# Patient Record
Sex: Female | Born: 1975 | Race: White | Hispanic: No | Smoking: Former smoker
Health system: Southern US, Community
[De-identification: ages and names within clinical notes are randomized; demographics above are authoritative.]

## PROBLEM LIST (undated history)

## (undated) DIAGNOSIS — F32A Depression, unspecified: Secondary | ICD-10-CM

## (undated) DIAGNOSIS — M51369 Other intervertebral disc degeneration, lumbar region without mention of lumbar back pain or lower extremity pain: Secondary | ICD-10-CM

## (undated) DIAGNOSIS — M5136 Other intervertebral disc degeneration, lumbar region: Secondary | ICD-10-CM

## (undated) DIAGNOSIS — I493 Ventricular premature depolarization: Secondary | ICD-10-CM

## (undated) DIAGNOSIS — F329 Major depressive disorder, single episode, unspecified: Secondary | ICD-10-CM

## (undated) DIAGNOSIS — D649 Anemia, unspecified: Secondary | ICD-10-CM

## (undated) DIAGNOSIS — F419 Anxiety disorder, unspecified: Secondary | ICD-10-CM

## (undated) DIAGNOSIS — I491 Atrial premature depolarization: Secondary | ICD-10-CM

## (undated) HISTORY — DX: Ventricular premature depolarization: I49.3

## (undated) HISTORY — DX: Depression, unspecified: F32.A

## (undated) HISTORY — DX: Anxiety disorder, unspecified: F41.9

## (undated) HISTORY — DX: Other intervertebral disc degeneration, lumbar region: M51.36

## (undated) HISTORY — DX: Other intervertebral disc degeneration, lumbar region without mention of lumbar back pain or lower extremity pain: M51.369

## (undated) HISTORY — DX: Atrial premature depolarization: I49.1

## (undated) HISTORY — DX: Major depressive disorder, single episode, unspecified: F32.9

---

## 2001-03-27 ENCOUNTER — Other Ambulatory Visit: Admission: RE | Admit: 2001-03-27 | Discharge: 2001-03-27 | Payer: Self-pay | Admitting: Internal Medicine

## 2001-05-01 ENCOUNTER — Ambulatory Visit (HOSPITAL_COMMUNITY): Admission: RE | Admit: 2001-05-01 | Discharge: 2001-05-01 | Payer: Self-pay | Admitting: Obstetrics and Gynecology

## 2001-05-01 ENCOUNTER — Encounter: Payer: Self-pay | Admitting: Obstetrics and Gynecology

## 2001-09-25 ENCOUNTER — Inpatient Hospital Stay (HOSPITAL_COMMUNITY): Admission: AD | Admit: 2001-09-25 | Discharge: 2001-09-28 | Payer: Self-pay | Admitting: Obstetrics and Gynecology

## 2004-02-02 ENCOUNTER — Other Ambulatory Visit: Admission: RE | Admit: 2004-02-02 | Discharge: 2004-02-02 | Payer: Self-pay | Admitting: Obstetrics and Gynecology

## 2004-04-14 ENCOUNTER — Inpatient Hospital Stay (HOSPITAL_COMMUNITY): Admission: AD | Admit: 2004-04-14 | Discharge: 2004-04-14 | Payer: Self-pay | Admitting: Obstetrics and Gynecology

## 2004-10-16 ENCOUNTER — Ambulatory Visit (HOSPITAL_COMMUNITY): Admission: RE | Admit: 2004-10-16 | Discharge: 2004-10-16 | Payer: Self-pay | Admitting: Neurology

## 2005-12-19 IMAGING — US US OB COMP LESS 14 WK
1 series · 14 of 28 positions shown · non-contrast
Comparison: none

CLINICAL DATA: Positive pregnancy test.  7 week 4 day gestational age by LMP. 
Bleeding.  Pelvic pain.

[Series 1: us ob comp less 14 wk · 0.33mm/px · 14 of 32 slices shown]
[im 2/32]
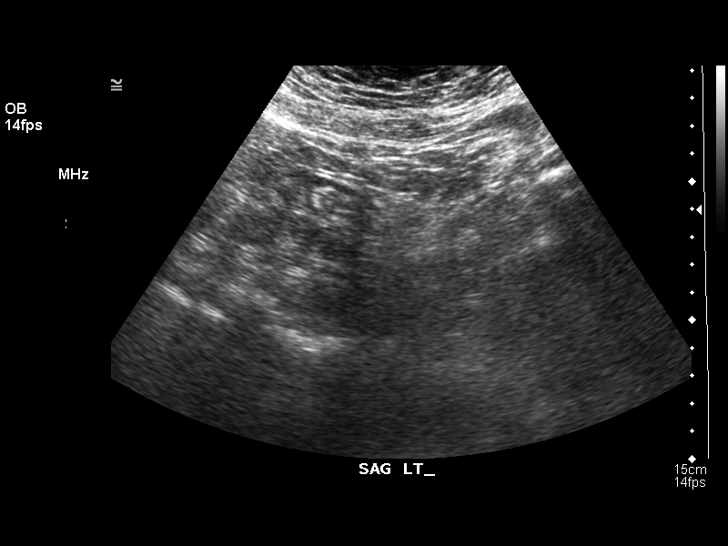
[im 4/32]
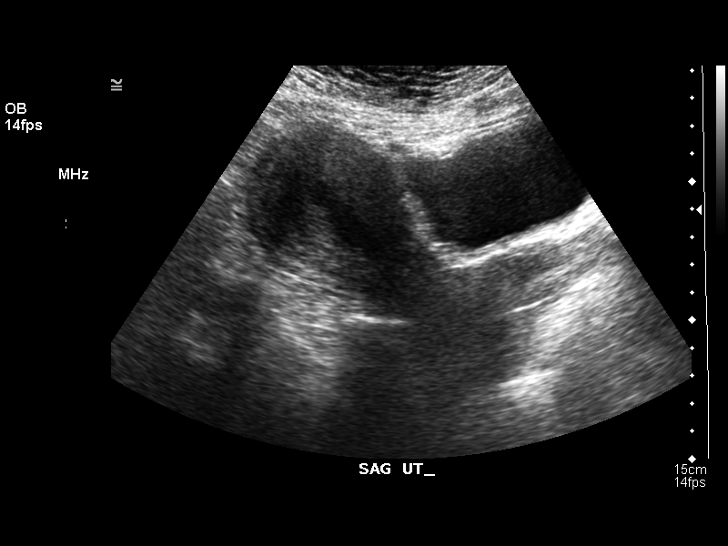
[im 6/32]
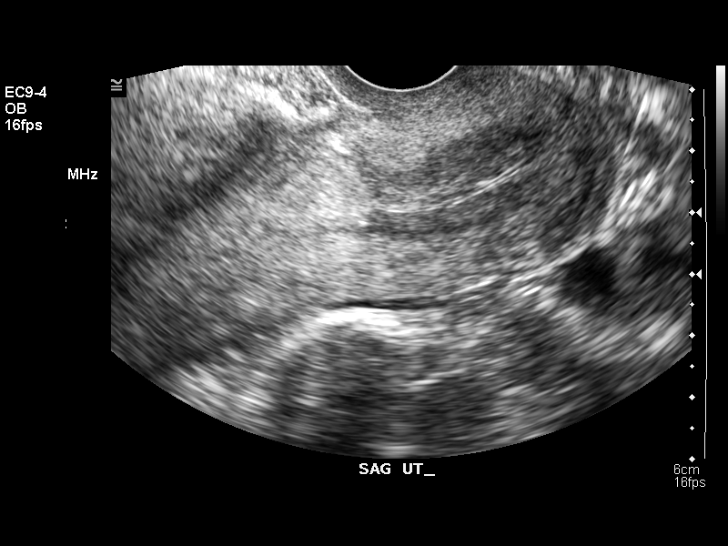
[im 9/32]
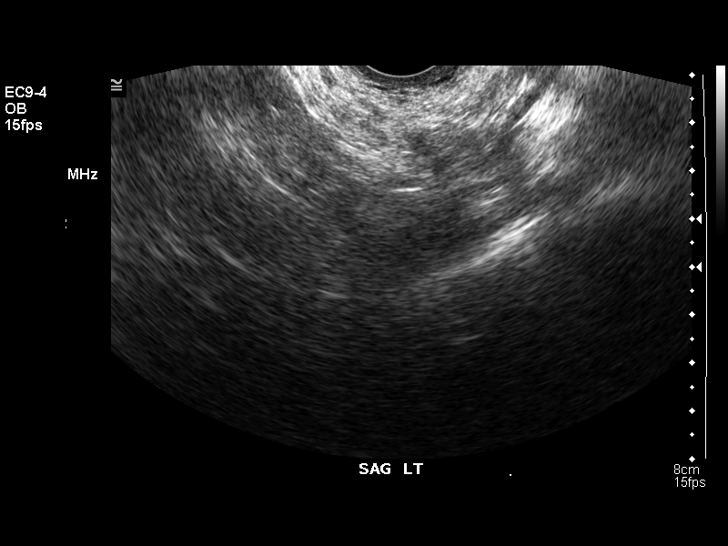
[im 11/32]
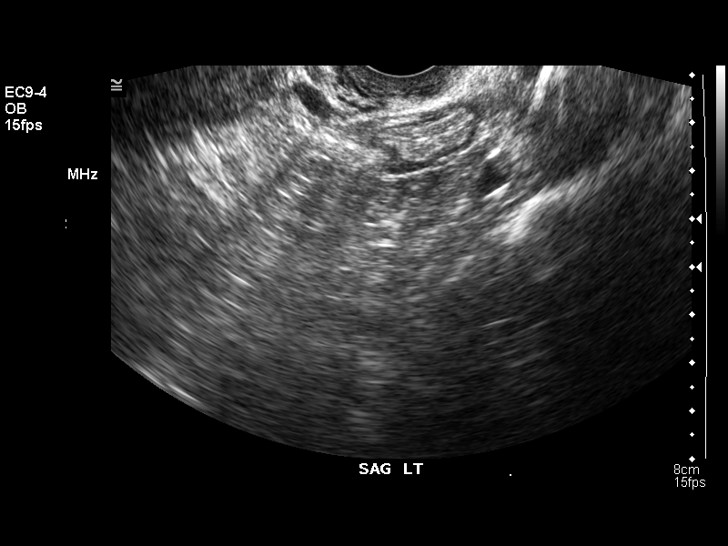
[im 13/32]
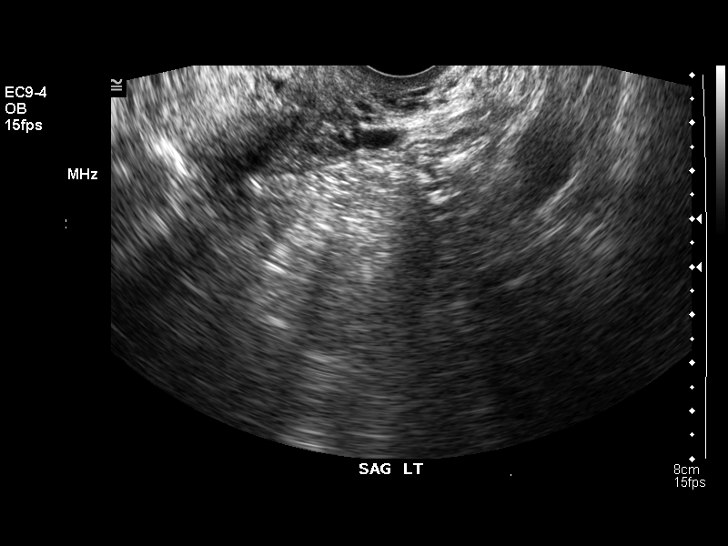
[im 15/32]
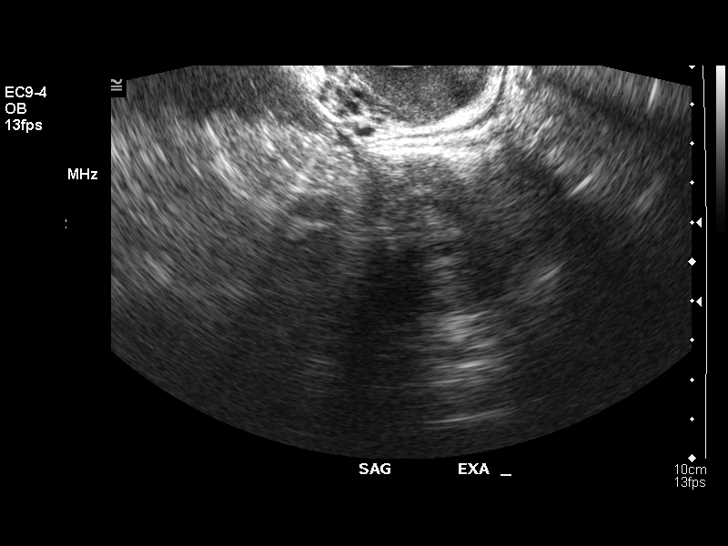
[im 18/32]
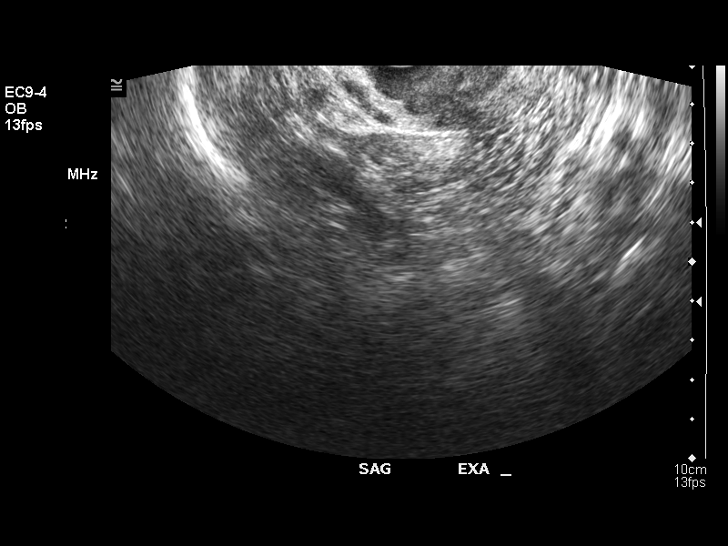
[im 20/32]
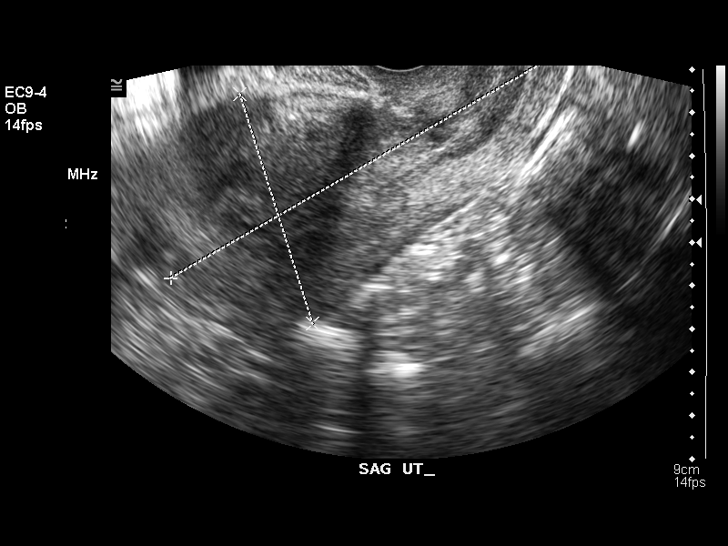
[im 22/32]
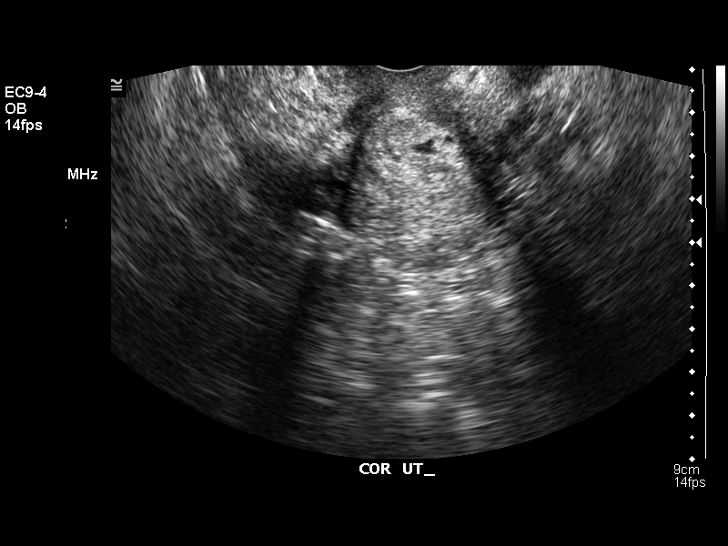
[im 25/32]
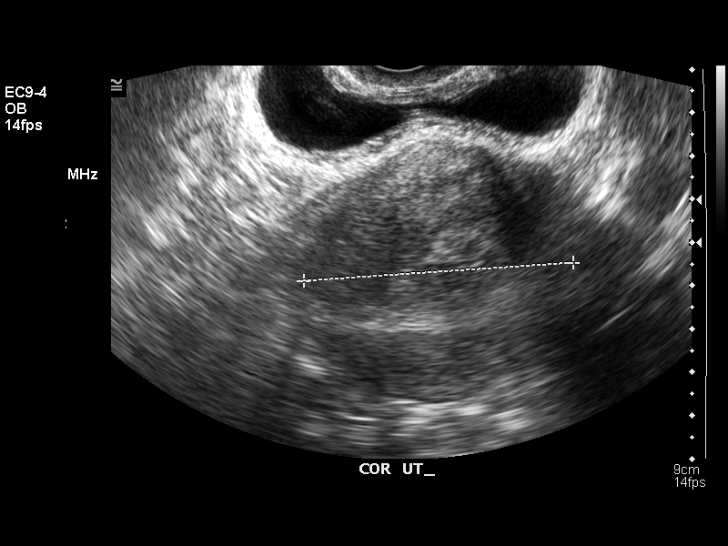
[im 27/32]
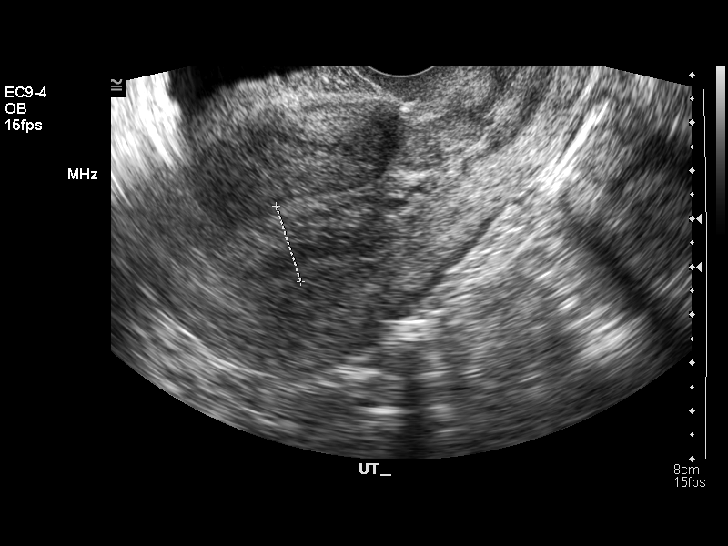
[im 29/32]
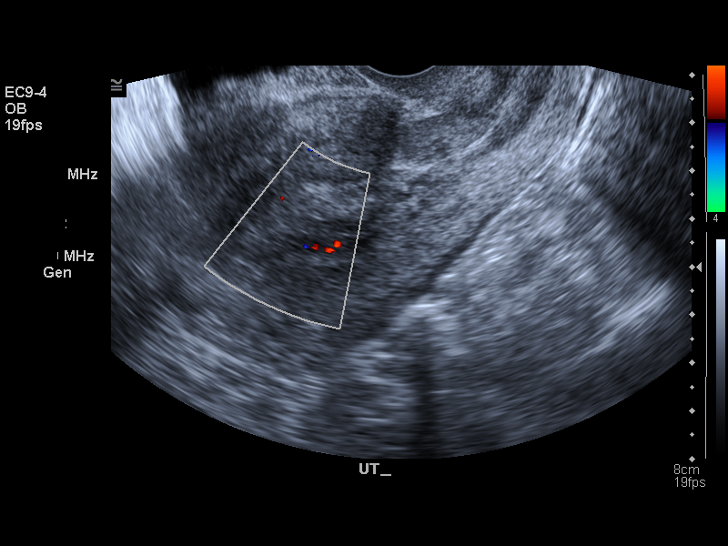
[im 32/32]
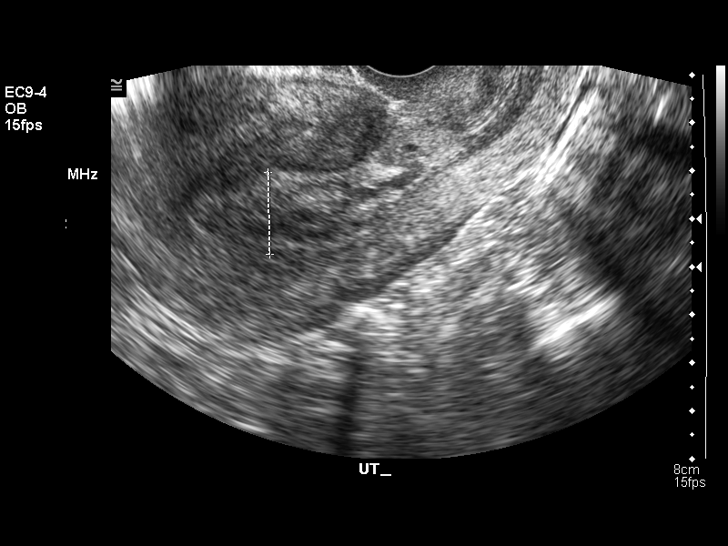

[14 of 28 positions shown; findings below may reference images not displayed]

OBSTETRICAL ULTRASOUND WITH TRANSVAGINAL:

Heterogeneous thickening of the endometrium is seen which measures 1.7 cm in
maximum thickness.  There is no evidence of an intrauterine gestational sac.  No
fibroids or other uterine abnormalities are identified.

Neither ovary was directly visualized by either transabdominal or transvaginal
sonography.  However, no adnexal masses are seen.  There is no evidence of free
fluid.
IMPRESSION: Heterogeneous thickening of the endometrium measuring approximately 1.7 cm.  No
evidence of intrauterine gestational sac, or adnexal mass or free fluid. 
Differential considerations include recent spontaneous abortion, early
intrauterine pregnancy, or an occult ectopic pregnancy.  Correlation with
quantitative beta HCG levels is recommended.

## 2011-03-22 DIAGNOSIS — R002 Palpitations: Secondary | ICD-10-CM | POA: Insufficient documentation

## 2011-03-22 DIAGNOSIS — G932 Benign intracranial hypertension: Secondary | ICD-10-CM | POA: Insufficient documentation

## 2013-04-26 ENCOUNTER — Other Ambulatory Visit (HOSPITAL_COMMUNITY): Payer: 59

## 2013-04-26 ENCOUNTER — Encounter (HOSPITAL_COMMUNITY): Payer: Self-pay
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
## 2013-04-27 ENCOUNTER — Other Ambulatory Visit (HOSPITAL_COMMUNITY): Payer: 59 | Attending: Psychiatry | Admitting: Psychiatry

## 2013-04-27 DIAGNOSIS — F411 Generalized anxiety disorder: Secondary | ICD-10-CM | POA: Insufficient documentation

## 2013-04-27 DIAGNOSIS — F339 Major depressive disorder, recurrent, unspecified: Secondary | ICD-10-CM | POA: Insufficient documentation

## 2013-04-27 DIAGNOSIS — F4310 Post-traumatic stress disorder, unspecified: Secondary | ICD-10-CM | POA: Insufficient documentation

## 2013-04-27 DIAGNOSIS — F332 Major depressive disorder, recurrent severe without psychotic features: Secondary | ICD-10-CM | POA: Insufficient documentation
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
## 2013-04-28 ENCOUNTER — Other Ambulatory Visit (HOSPITAL_COMMUNITY): Payer: Self-pay | Admitting: Psychiatry

## 2013-04-28 ENCOUNTER — Other Ambulatory Visit (INDEPENDENT_AMBULATORY_CARE_PROVIDER_SITE_OTHER): Payer: 59 | Admitting: Psychiatry

## 2013-04-28 ENCOUNTER — Ambulatory Visit (HOSPITAL_COMMUNITY)
Admission: RE | Admit: 2013-04-28 | Discharge: 2013-04-28 | Disposition: A | Discharge status: Home / Self Care | Payer: 59 | Source: Ambulatory Visit | Attending: Psychiatry | Admitting: Psychiatry

## 2013-04-28 DIAGNOSIS — I491 Atrial premature depolarization: Secondary | ICD-10-CM

## 2013-04-28 DIAGNOSIS — F339 Major depressive disorder, recurrent, unspecified: Secondary | ICD-10-CM

## 2013-04-28 DIAGNOSIS — Z79899 Other long term (current) drug therapy: Secondary | ICD-10-CM | POA: Insufficient documentation

## 2013-04-28 DIAGNOSIS — F329 Major depressive disorder, single episode, unspecified: Secondary | ICD-10-CM
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
## 2013-04-30 ENCOUNTER — Other Ambulatory Visit (HOSPITAL_COMMUNITY): Payer: 59

## 2013-05-03 ENCOUNTER — Other Ambulatory Visit (HOSPITAL_COMMUNITY): Payer: 59 | Attending: Psychiatry | Admitting: Psychiatry

## 2013-05-03 DIAGNOSIS — F411 Generalized anxiety disorder: Secondary | ICD-10-CM | POA: Insufficient documentation

## 2013-05-03 DIAGNOSIS — F4310 Post-traumatic stress disorder, unspecified: Secondary | ICD-10-CM | POA: Insufficient documentation

## 2013-05-03 DIAGNOSIS — F332 Major depressive disorder, recurrent severe without psychotic features: Secondary | ICD-10-CM | POA: Insufficient documentation

## 2013-05-03 DIAGNOSIS — F339 Major depressive disorder, recurrent, unspecified: Secondary | ICD-10-CM

## 2013-05-03 NOTE — Progress Notes (Signed)
    Daily Group Progress Note  Program: IOP  Group Time: 9:00-10:30 am   Participation Level: Active  Behavioral Response: Appropriate  Type of Therapy:  Process Group  Summary of Progress: Pt said she had a good Thanksgiving holiday with her family, but struggled with managing the anxiety associated with having family in from out of town and with the year anniversary of her husbands death this week. Pt said she was grateful to learn the tool of heartmath in the last session to manage stress symptoms and said she downloaded an app to her phone and used it three times a day for five minutes. Pt said she could notice the reduction in anxiety symptoms as a result.  Pt also talked about the difficulty in trying to parent her two boys while struggling with her own depression symptoms. Pt received support from another mother who is also struggling with a similar issue.      Group Time: 10:30 am - 12:00 pm   Participation Level:  Active  Behavioral Response: Appropriate  Type of Therapy: Psycho-education Group  Summary of Progress: Pt participated in a group with a focus on grief and loss and discussed current losses being experienced.   Carman Ching, LCSW

## 2013-05-04 ENCOUNTER — Other Ambulatory Visit (HOSPITAL_COMMUNITY): Payer: 59 | Admitting: Psychiatry

## 2013-05-04 DIAGNOSIS — F339 Major depressive disorder, recurrent, unspecified: Secondary | ICD-10-CM

## 2013-05-04 NOTE — Progress Notes (Signed)
    Daily Group Progress Note  Program: IOP  Group Time: 9:00-10:30 am   Participation Level: Active  Behavioral Response: Appropriate  Type of Therapy:  Process Group  Summary of Progress: Pt said her main stressor is a fear of returning to work and not feeling ready. Pt said she still lacks energy and motivation to accomplish personal and work related tasks and does not know how she will return to work if she can not function. Pt is working on increasing her self-care activities to help her feel more energized to tackle daily living.      Group Time: 10:30 am - 12:00 pm   Participation Level:  Active  Behavioral Response: Appropriate  Type of Therapy: Psycho-education Group  Summary of Progress: Pt learned the DBT skills of distress tolerance to manage stressful situations that can't be fixed immediately.   Carman Ching, LCSW

## 2013-05-05 ENCOUNTER — Other Ambulatory Visit (HOSPITAL_COMMUNITY): Payer: 59 | Admitting: Psychiatry

## 2013-05-05 DIAGNOSIS — F339 Major depressive disorder, recurrent, unspecified: Secondary | ICD-10-CM

## 2013-05-05 MED ORDER — VENLAFAXINE HCL ER 37.5 MG PO CP24: 37.5000 mg | ORAL_CAPSULE | Freq: Every day | ORAL | Status: DC

## 2013-05-05 NOTE — Progress Notes (Signed)
    Daily Group Progress Note  Program: IOP  Group Time: 9:00-10:30 am   Participation Level: Active  Behavioral Response: Appropriate  Type of Therapy:  Process Group  Summary of Progress: Pt had elevated mood and affect today and said "for the first time in a long time I have hope". Pt said she engaged in time for herself yesterday and believes it helped improve her mood and made her feel more "recharged". Pt shared that she still struggles with how to grieve her husbands death and often feels "disconnected" from her emotions. Pt talked about tomorrow being the one year anniversary of her husbands death and how she plans to manage through the difficulty of the day.      Group Time: 10:30 am - 12:00 pm   Participation Level:  Active  Behavioral Response: Appropriate  Type of Therapy: Psycho-education Group  Summary of Progress: Pt reviewed the personal bill of rights and discussed how they apply to wellness and need to be accepted in order to begin the healing process. Pt identified the statements that they most related to and why it pertained to their current situation in life.    Carman Ching, LCSW

## 2013-05-05 NOTE — Progress Notes (Signed)
Patient ID: Natalie Maxwell, female   DOB: 1976/01/21, 37 y.o.   MRN: [id number] Patient reviewed and interviewed, her EKG came back normal and she is tolerating her medications well. States she feels tired all day and so discussed switching her Effexor to night time. Appetite is improving mood is more calmer than she is not as anxious as before. Denies suicidal or homicidal ideation and no hallucinations or delusions. Discussed increasing Effexor a total of 187.5 mg patient is comfortable doing that.

## 2013-05-06 ENCOUNTER — Other Ambulatory Visit (HOSPITAL_COMMUNITY): Payer: 59 | Admitting: Psychiatry

## 2013-05-06 DIAGNOSIS — F339 Major depressive disorder, recurrent, unspecified: Secondary | ICD-10-CM

## 2013-05-07 ENCOUNTER — Other Ambulatory Visit (HOSPITAL_COMMUNITY): Payer: 59 | Admitting: Psychiatry

## 2013-05-07 DIAGNOSIS — F339 Major depressive disorder, recurrent, unspecified: Secondary | ICD-10-CM

## 2013-05-07 NOTE — Progress Notes (Signed)
    Daily Group Progress Note  Program: IOP  Group Time: 9:00-10:30 am   Participation Level: Active  Behavioral Response: Appropriate  Type of Therapy:  Process Group  Summary of Progress: Today was the one year anniversary of her husbands murder and Pt was tearful and said she "misses him". Pt processed her grief and expressed feelings of loss and sadness while receiving support from others. Pt said she thought about not attending group but after sharing she said she was glad she came.      Group Time: 10:30 am - 12:00 pm   Participation Level:  Active  Behavioral Response: Appropriate  Type of Therapy: Psycho-education Group  Summary of Progress: Pt learned about self esteem and the origins and how it shapes the overall sense of self worth today.   Carman Ching, LCSW

## 2013-05-07 NOTE — Progress Notes (Signed)
    Daily Group Progress Note  Program: IOP  Group Time: 9:00-10:30 am   Participation Level: Active  Behavioral Response: Appropriate  Type of Therapy:  Process Group  Summary of Progress: Pt reports high depression, hopelessness, fatigue, low energy, low motivation and concerns about getting over her depression and ability to resume a normal level of functioning. Pt said she struggles to get out of the bed and worries about how she will be able to function if she has to return to work in this condition. Pt is aware she has made progress in the group but is struggling with extremely negative self talk and sabotages her wellness and she struggles with being in the present moment and instead worries about the future.      Group Time: 10:30 am - 12:00 pm   Participation Level:  Active  Behavioral Response: Appropriate  Type of Therapy: Psycho-education Group  Summary of Progress: Pt did an activity to emphasize how negative self talk contributes to low self esteem and depression and then did a positive qualities record.   Carman Ching, LCSW

## 2013-05-10 ENCOUNTER — Other Ambulatory Visit (HOSPITAL_COMMUNITY): Payer: 59 | Admitting: Psychiatry

## 2013-05-10 DIAGNOSIS — F339 Major depressive disorder, recurrent, unspecified: Secondary | ICD-10-CM

## 2013-05-10 NOTE — Progress Notes (Signed)
    Daily Group Progress Note  Program: IOP  Group Time: 9:00-10:30 am   Participation Level: Active  Behavioral Response: Appropriate  Type of Therapy:  Process Group  Summary of Progress: Pt still reports feelings of hopeless about getting better with her depression and said she still had low motivation, energy and is not able to function outside of this group setting. Pt said she questions how she can feel happiness again and expressed fears about living feeling depressed for the rest of her life. Pt denied thoughts of suicide and said she has her kids to live for, but said she needed a space to talk about how bad her depression feels.      Group Time: 10:30 am - 12:00 pm   Participation Level:  Active  Behavioral Response: Appropriate  Type of Therapy: Psycho-education Group  Summary of Progress: Pt participated in a group with a focus on grief and loss, identified current losses and effective grieving strategies.   Carman Ching, LCSW

## 2013-05-11 ENCOUNTER — Other Ambulatory Visit (HOSPITAL_COMMUNITY): Payer: 59 | Admitting: Psychiatry

## 2013-05-11 DIAGNOSIS — F339 Major depressive disorder, recurrent, unspecified: Secondary | ICD-10-CM

## 2013-05-11 NOTE — Progress Notes (Signed)
    Daily Group Progress Note  Program: IOP  Group Time: 9:00-10:30 am   Participation Level: Active  Behavioral Response: Appropriate  Type of Therapy:  Process Group  Summary of Progress: Pt reports feeling "cautisouly optimistic" today for the first time since starting group reports feeling "ok". Pt said her depression is improving and she did not take a nap yesterday for the first time since joining the group. Pts affect improved and she was attentive and engaged with a more hopeful outlook. Pt is scheduled to end group on Thursday and inquired about some additional support groups and services to attend in addition to individual therapy as part of her transition plan.      Group Time: 10:30 am - 12:00 pm   Participation Level:  Active  Behavioral Response: Appropriate  Type of Therapy: Psycho-education Group  Summary of Progress: Pt used how to use a breathing technique to manage stress and anxiety. The technique was practiced during this session and Pt reported a reduction in experienced stress symptoms.   Carman Ching, LCSW

## 2013-05-11 NOTE — Progress Notes (Signed)
Patient ID: Natalie Maxwell, female   DOB: August 12, 1975, 37 y.o.   MRN: [id number] Pt seen states that she didn't start her increased dose of effexxor, states she will increase it today. No si/hi. No hall/ del, still anxious.

## 2013-05-12 ENCOUNTER — Other Ambulatory Visit (HOSPITAL_COMMUNITY): Payer: 59 | Admitting: Psychiatry

## 2013-05-12 DIAGNOSIS — F339 Major depressive disorder, recurrent, unspecified: Secondary | ICD-10-CM

## 2013-05-12 NOTE — Progress Notes (Signed)
    Daily Group Progress Note  Program: IOP  Group Time: 9:00-10:30 am   Participation Level: Active  Behavioral Response: Appropriate  Type of Therapy:  Process Group  Summary of Progress: Pt worked on her goal of depression and reports minimal depression symptoms today with increased hopefulness. Pt had makeup on with her hair done and said she is putting more effort into her appearance again. Pt said she has reduced her napping during the day and is doing more activities with her children. Pt said she also bought the [place] program to continue using this tool to help manage anxiety symptoms since she found it helpful during the group. Pt is scheduled to end group tomorrow and said she has made progress with improving her self-esteem, working through the grief and loss of her husbands death and managing her own depression symptoms.      Group Time: 10:30 am - 12:00 pm   Participation Level:  Active  Behavioral Response: Appropriate  Type of Therapy: Psycho-education Group  Summary of Progress: Pt was introduced to Boundaries as a way to ensure wellness and reduce depression. Pt learned about the different personality styles that impact effective boundary setting.   Carman Ching, LCSW

## 2013-05-13 ENCOUNTER — Other Ambulatory Visit (HOSPITAL_COMMUNITY): Payer: 59 | Admitting: Psychiatry

## 2013-05-13 DIAGNOSIS — F339 Major depressive disorder, recurrent, unspecified: Secondary | ICD-10-CM

## 2013-05-13 NOTE — Patient Instructions (Signed)
Patient completed MH-IOP today.  Will follow up with Dr. Lolly Mustache on 05-18-13 at 9 a.m and Boneta Lucks, [facility] on 05-24-13 @ 9am.  Encouraged support groups.  Return to work on 05-31-13 without any restrictions.

## 2013-05-13 NOTE — Progress Notes (Signed)
Daischarge Note  Patient:  Natalie Maxwell is an 37 y.o., female DOB:  09/28/1975  Date of Admission:  04/26/13  Date of Discharge:  05/13/13  Reason for Admission:Depression and anxiety  Hospital Course: Patient started IOP and began talking about her issues with her children over the death of their father. She did well with this. An EKG was done to rule out abnormalities because of her past history and it was found to be normal. Patient was asked to increase her Effexor X.R to  187.5 mg. She was initially hesitant about increasing it but with appropriate encouragement did so. Her sleep and appetite improved mood also improved as did her motivation she was coping well and tolerating her medications well and had no anxiety. She did well in group therapy and felt she had benefited from group therapy.  Mental Status at Discharge: Alert, oriented x3, affect is full mood is bright speech is normal with no suicidal or homicidal ideation. No hallucinations or delusions. Recent and remote memory is good, judgment and insight is good, concentration and recall are good.  Lab Results: No results found for this or any previous visit (from the past 48 hour(s)).  Current outpatient prescriptions:venlafaxine XR (EFFEXOR XR) 37.5 MG 24 hr capsule, Take 1 capsule (37.5 mg total) by mouth daily., Disp: 30 capsule, Rfl: 2;  venlafaxine XR (EFFEXOR-XR) 150 MG 24 hr capsule, Take 150 mg by mouth daily with breakfast., Disp: , Rfl:   Axis Diagnosis:   Axis I: Generalized Anxiety Disorder and Major Depression, Recurrent severe Axis II: Cluster C Traits Axis III:  Past Medical History  Diagnosis Date  . Anxiety   . Depression   . PAC (premature atrial contraction)   . PVC (premature ventricular contraction)   . Degenerative disc disease, lumbar    Axis IV: economic problems, occupational problems, other psychosocial or environmental problems, problems related to social environment and problems with primary  support group Axis V: 61-70 mild symptoms   Level of Care:  OP  Discharge destination:  Home  Is patient on multiple antipsychotic therapies at discharge:  No    Has Patient had three or more failed trials of antipsychotic monotherapy by history:  No  Patient phone:  [phone] (home)  Patient address:   [address] [place] [place] [address],   Follow-up recommendations:  Activity:  As tolerated Diet:  Regular Other:  Followup that Dr.Arfeen for medications and Gennifer brown for therapy  Comments:    The patient received suicide prevention pamphlet:  Yes  Margit Banda 05/13/2013, 11:24 AM
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
## 2013-05-13 NOTE — Progress Notes (Signed)
    Daily Group Progress Note  Program: IOP  Group Time: 9:00-10:30 am   Participation Level: Active  Behavioral Response: Appropriate  Type of Therapy:  Process Group  Summary of Progress: Today was Pts final day in the group. Pt worked on her goal of depression and talked bout the progress she has made with her depression symptoms. Pt said she did not think she would found feelings of hopefulness from being in this program and stated she feels hopeful about her future, with improved self esteem. Pt rated depression symptoms low and said she is not napping anymore during the day and engaging in more activities with her children.      Group Time: 10:30 am - 12:00 pm   Participation Level:  Active  Behavioral Response: Appropriate  Type of Therapy: Psycho-education Group  Summary of Progress: Pt learned about the different types of boundaries that can be set with others to ensure wellness and picked up from the introduction to boundaries that was covered yesterday.   Carman Ching, LCSW

## 2013-05-14 ENCOUNTER — Other Ambulatory Visit (HOSPITAL_COMMUNITY): Payer: 59

## 2013-05-17 ENCOUNTER — Other Ambulatory Visit (HOSPITAL_COMMUNITY): Payer: 59

## 2013-05-18 ENCOUNTER — Encounter (HOSPITAL_COMMUNITY): Payer: Self-pay | Admitting: Psychiatry

## 2013-05-18 ENCOUNTER — Ambulatory Visit (INDEPENDENT_AMBULATORY_CARE_PROVIDER_SITE_OTHER): Payer: 59 | Admitting: Psychiatry

## 2013-05-18 ENCOUNTER — Other Ambulatory Visit (HOSPITAL_COMMUNITY): Payer: 59

## 2013-05-18 VITALS — BP 127/75 | HR 88 | Ht 63.0 in | Wt 227.0 lb

## 2013-05-18 DIAGNOSIS — F331 Major depressive disorder, recurrent, moderate: Secondary | ICD-10-CM

## 2013-05-18 DIAGNOSIS — F339 Major depressive disorder, recurrent, unspecified: Secondary | ICD-10-CM
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
## 2013-05-19 ENCOUNTER — Other Ambulatory Visit (HOSPITAL_COMMUNITY): Payer: 59

## 2013-05-20 ENCOUNTER — Other Ambulatory Visit (HOSPITAL_COMMUNITY): Payer: 59

## 2013-05-21 ENCOUNTER — Other Ambulatory Visit (HOSPITAL_COMMUNITY): Payer: 59

## 2013-05-24 ENCOUNTER — Ambulatory Visit (HOSPITAL_COMMUNITY): Payer: Self-pay | Admitting: Psychiatry

## 2013-05-24 ENCOUNTER — Other Ambulatory Visit (HOSPITAL_COMMUNITY): Payer: 59

## 2013-05-25 ENCOUNTER — Other Ambulatory Visit (HOSPITAL_COMMUNITY): Payer: 59

## 2013-05-26 ENCOUNTER — Other Ambulatory Visit (HOSPITAL_COMMUNITY): Payer: 59

## 2013-05-28 ENCOUNTER — Other Ambulatory Visit (HOSPITAL_COMMUNITY): Payer: 59

## 2013-05-31 ENCOUNTER — Other Ambulatory Visit (HOSPITAL_COMMUNITY): Payer: 59

## 2013-06-01 ENCOUNTER — Telehealth (HOSPITAL_COMMUNITY): Payer: Self-pay | Admitting: *Deleted

## 2013-06-01 NOTE — Telephone Encounter (Signed)
Disability forms for pt received by fax in office, which stated pt had applied for short term disability. Contacted pt per Dr. Sheela Stack instruction to advise that her return to work date is/was 05/31/13 and if she is not able to return to work, she needs to be seen. Pt states she is currently on vacation that was scheduled prior to IOP, and that she will return to work after vacation.She states employer should know this, but that she will contact employer to clarify. Pt states she does not require further extension of her leave.

## 2013-06-09 ENCOUNTER — Encounter (HOSPITAL_COMMUNITY): Payer: Self-pay | Admitting: Psychiatry

## 2013-06-09 ENCOUNTER — Ambulatory Visit (INDEPENDENT_AMBULATORY_CARE_PROVIDER_SITE_OTHER): Payer: 59 | Admitting: Psychiatry

## 2013-06-09 DIAGNOSIS — F339 Major depressive disorder, recurrent, unspecified: Secondary | ICD-10-CM

## 2013-06-09 DIAGNOSIS — F3289 Other specified depressive episodes: Secondary | ICD-10-CM

## 2013-06-09 DIAGNOSIS — F329 Major depressive disorder, single episode, unspecified: Secondary | ICD-10-CM

## 2013-06-09 NOTE — Progress Notes (Signed)
Patient ID: Natalie Maxwell, female   DOB: [birth year], 38 y.o.   MRN: [id number] Presenting Problem Chief Complaint: depression  What are the main stressors in your life right now, how long? Concerns about 74 year old son not going to school/resistant to therapy; return to work; grief/guilt related to husband's death; prioritizing self-care  Previous mental health services Have you ever been treated for a mental health problem? Yes    Are you currently seeing a therapist or counselor, counselor's name? No   Have you ever had a mental health hospitalization, how many times, length of stay? No   Have you ever been treated with medication? Yes   Have you ever had suicidal thoughts or attempted suicide, when, how? No   Risk factors for Suicide Demographic factors:  Divorced or widowed and Caucasian Current mental status: none reported Loss factors: Loss of significant relationship Historical factors: none Risk Reduction factors: Positive social support Clinical factors:  depression Cognitive features that contribute to risk: none  SUICIDE RISK:  Minimal: No identifiable suicidal ideation.  Patients presenting with no risk factors but with morbid ruminations; may be classified as minimal risk based on the severity of the depressive symptoms   Social/family history Have you been married, how many times?  Husband of 17 years died in 2013-12-15after long period of addiction, was killed by his girlfriend  Do you have children?  yes   Who lives in your current household? Lives with 2 Ivin Booty) and 27 Gerilyn Pilgrim) year old sons   Military history: No   Religious/spiritual involvement:  What religion/faith base are you? deferred  Family of origin (childhood history)  Where were you born? [place], [place] Where did you grow up?   Describe the atmosphere of the household where you grew up: loving, supportive Do you have siblings, step/half siblings? Yes  Are your parents  separated/divorced, when and why? No   Are your parents alive? Yes   Social supports (personal and professional): mother, father, 4 siblings, several good friends  Education How many grades have you completed? high school diploma/GED Did you have any problems in school, what type? No  Medications prescribed for these problems? No   Employment (financial issues) Psychiatric nurse history none  Trauma/Abuse history: Have you ever been exposed to any form of abuse, what type? No   Have you ever been exposed to something traumatic, describe? No   Substance use None reported  Mental Status: General Appearance Luretha Murphy:  Casual Eye Contact:  Good Motor Behavior:  Normal Speech:  Normal Level of Consciousness:  Alert Mood:  Depressed Affect:  Appropriate Anxiety Level:  minimal Thought Process:  Coherent Thought Content:  WNL Perception:  Normal Judgment:  Good Insight:  Present Cognition:  wnl  Diagnosis AXIS I Depressive Disorder NOS  AXIS II No diagnosis  AXIS III Past Medical History  Diagnosis Date  . Anxiety   . Depression   . PAC (premature atrial contraction)   . PVC (premature ventricular contraction)   . 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 Pt. Reports concern about 67 year old son who is refusing to go to school, worried that he will not graduate from high school in May. Session focused on grief process, recovery from co-dependent relationship,  Prioritizing self-care, identified strengths: compassionate, genuinely loves others, determined, enjoys the  arts and beauty, interests in crafts and stained glass.  Plan: Pt. Encouraged to continue focusing on breath with attention to slowing the exhale. Pt. To return in 2 weeks.  _________________________________________ Boneta LucksJennifer Brown,  Ph.D., LPC, NCC

## 2013-06-24 ENCOUNTER — Encounter (HOSPITAL_COMMUNITY): Payer: Self-pay | Admitting: Psychiatry

## 2013-06-24 ENCOUNTER — Ambulatory Visit (INDEPENDENT_AMBULATORY_CARE_PROVIDER_SITE_OTHER): Payer: 59 | Admitting: Psychiatry

## 2013-06-24 DIAGNOSIS — F339 Major depressive disorder, recurrent, unspecified: Secondary | ICD-10-CM
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
## 2013-06-29 ENCOUNTER — Ambulatory Visit (HOSPITAL_COMMUNITY): Payer: Self-pay | Admitting: Psychiatry

## 2013-07-05 ENCOUNTER — Telehealth (HOSPITAL_COMMUNITY): Payer: Self-pay | Admitting: *Deleted

## 2013-07-05 ENCOUNTER — Telehealth (HOSPITAL_COMMUNITY): Payer: Self-pay | Admitting: Psychiatry

## 2013-07-05 ENCOUNTER — Other Ambulatory Visit (HOSPITAL_COMMUNITY): Payer: Self-pay | Admitting: Psychiatry

## 2013-07-05 MED ORDER — VENLAFAXINE HCL ER 150 MG PO CP24: 150.0000 mg | ORAL_CAPSULE | Freq: Every day | ORAL | Status: DC

## 2013-07-05 NOTE — Telephone Encounter (Signed)
Refill of Effexor is given.

## 2013-07-05 NOTE — Telephone Encounter (Signed)
Pt left WU:[id number]:Needs refill of Effexor 150 mg. Pt states she missed her appt with Dr. Lolly MustacheArfeen and needs to reschedule. States she had a refill on Effexor 37.5mg  but not on Effexor 150 mg.States she does not want to go thru withdrawal.

## 2013-07-06 ENCOUNTER — Other Ambulatory Visit (HOSPITAL_COMMUNITY): Payer: Self-pay | Admitting: *Deleted

## 2013-07-06 DIAGNOSIS — F331 Major depressive disorder, recurrent, moderate: Secondary | ICD-10-CM

## 2013-07-06 MED ORDER — VENLAFAXINE HCL ER 150 MG PO CP24: 150.0000 mg | ORAL_CAPSULE | Freq: Every day | ORAL | Status: DC

## 2013-07-06 NOTE — Telephone Encounter (Signed)
Refill of Effexor printed in error by MD Sent thru [facility] at this time per request of Dr.Arfeen

## 2013-07-08 ENCOUNTER — Ambulatory Visit (HOSPITAL_COMMUNITY): Payer: Self-pay | Admitting: Psychiatry

## 2013-07-20 ENCOUNTER — Ambulatory Visit (HOSPITAL_COMMUNITY): Payer: Self-pay | Admitting: Psychiatry

## 2013-07-21 ENCOUNTER — Ambulatory Visit (HOSPITAL_COMMUNITY): Payer: Self-pay | Admitting: Psychiatry

## 2013-08-04 ENCOUNTER — Ambulatory Visit (HOSPITAL_COMMUNITY): Payer: Self-pay | Admitting: Psychiatry

## 2013-09-08 ENCOUNTER — Other Ambulatory Visit (HOSPITAL_COMMUNITY): Payer: Self-pay | Admitting: *Deleted

## 2013-09-08 DIAGNOSIS — F331 Major depressive disorder, recurrent, moderate: Secondary | ICD-10-CM

## 2013-09-08 MED ORDER — VENLAFAXINE HCL ER 37.5 MG PO CP24: 37.5000 mg | ORAL_CAPSULE | Freq: Every day | ORAL | Status: DC

## 2013-09-08 NOTE — Telephone Encounter (Signed)
Received faxed refill request for Venlafaxine ER 37.5mg  daily Patient last appt with Dr. Lolly MustacheArfeen 05/18/13. No appt scheduled. Reviewed by Dr. Rutherford Limerickadepalli (in Dr. Sheela StackArfeen's absence).  30 day supply was authorized with request to flag RX and contact patient to make appt. Patient contacted, left message to call office for appt.

## 2015-09-22 ENCOUNTER — Encounter: Payer: Self-pay | Admitting: Neurology

## 2015-09-22 ENCOUNTER — Ambulatory Visit (INDEPENDENT_AMBULATORY_CARE_PROVIDER_SITE_OTHER): Payer: 59 | Admitting: Neurology

## 2015-09-22 VITALS — BP 116/76 | HR 82 | Ht 63.0 in | Wt 199.2 lb

## 2015-09-22 DIAGNOSIS — H539 Unspecified visual disturbance: Secondary | ICD-10-CM | POA: Diagnosis not present

## 2015-09-22 DIAGNOSIS — H905 Unspecified sensorineural hearing loss: Secondary | ICD-10-CM

## 2015-09-22 DIAGNOSIS — H4710 Unspecified papilledema: Secondary | ICD-10-CM | POA: Diagnosis not present

## 2015-09-22 DIAGNOSIS — H9190 Unspecified hearing loss, unspecified ear: Secondary | ICD-10-CM

## 2015-09-22 DIAGNOSIS — G932 Benign intracranial hypertension: Secondary | ICD-10-CM

## 2015-09-22 DIAGNOSIS — G4489 Other headache syndrome: Secondary | ICD-10-CM | POA: Diagnosis not present

## 2015-09-22 MED ORDER — ACETAZOLAMIDE 250 MG PO TABS: 250.0000 mg | ORAL_TABLET | Freq: Two times a day (BID) | ORAL | Status: DC

## 2015-09-22 NOTE — Progress Notes (Signed)
[facility]    Provider:  Dr Lucia Gaskins Referring Provider: Ander Purpura, OD Primary Care Physician:  Pcp Not In System  CC:  pseudotumor  HPI:  Natalie Maxwell is a 40 y.o. female here as a referral from Dr. Yetta Barre for pseudotumor cerebri. PMHx depression, anxiety. She has a previous history in 2006. Symptoms worsening over the last several months. No inciting events or head trauma, She has headaches now. She has pressure in the head mostly at night and wooshing in the ears, not ringing. She has vision changes. Pressure is behind the eyes. Daily. She has nausea and vomiting.She needs to lay down and sleeps. Symptoms happen more at the end of the day and then gets worse at night when she lays down. Better when she is upright. Commonly 2-3/10 but can be 7/10 in pain with nausea. No pain on eye movement. No Hx of MS or autoimmune disorder. No Fhx of MS or autoimmune disorder. No recent weight gain, no use of acutane or long-term tetracyclines.   Reviewed notes, labs and imaging from outside physicians, which showed: reviewed: reviewed optometrist notes. OD 20/30, OD 20/30 -1. Papilledema ou. Otherwise exam normal. Reviewed notes from [facility] back to 2012, none pertaining to her IIH.   Review of Systems: Patient complains of symptoms per HPI as well as the following symptoms: eye pain, anemia, headache, depression. Pertinent negatives per HPI. All others negative.   Social History   Social History  . Marital Status: Widowed    Spouse Name: N/A  . Number of Children: 2  . Years of Education: some colle   Occupational History  . [employer]    Social History Main Topics  . Smoking status: Current Every Day Smoker -- 1.00 packs/day    Types: Cigarettes  . Smokeless tobacco: Not on file  . Alcohol Use: 1.8 oz/week    3 Cans of beer per week     Comment: rare  . Drug Use: No  . Sexual Activity: Yes   Other Topics Concern  . Not on file   Social History  Narrative   Lives with 2 children   Caffeine use: none    Family History  Problem Relation Age of Onset  . Anxiety disorder Sister   . Anxiety disorder Brother   . Alcohol abuse Maternal Aunt   . Anxiety disorder Maternal Grandmother   . Depression Maternal Grandmother   . Heart disease Maternal Grandmother   . Anxiety disorder Paternal Grandmother   . Depression Paternal Grandmother   . Anxiety disorder Sister   . Anxiety disorder Sister   . Anxiety disorder Sister   . Colon cancer Paternal Grandfather   . Migraines Neg Hx     Past Medical History  Diagnosis Date  . Anxiety   . Depression   . PAC (premature atrial contraction)   . PVC (premature ventricular contraction)   . Degenerative disc disease, lumbar     Past Surgical History  Procedure Laterality Date  . Cesarean section  2003, 1997    Current Outpatient Prescriptions  Medication Sig Dispense Refill  . iron polysaccharides (POLY-IRON 150) 150 MG capsule Take 150 mg by mouth 2 (two) times daily.    Marland Kitchen PRISTIQ 50 MG 24 hr tablet Take 50 mg by mouth daily.  3  . acetaZOLAMIDE (DIAMOX) 250 MG tablet Take 1 tablet (250 mg total) by mouth 2 (two) times daily. 60 tablet 12   No current facility-administered medications for this visit.  Allergies as of 09/22/2015 - Review Complete 06/24/2013  Allergen Reaction Noted  . Erythromycin Nausea Only 09/22/2015    Vitals: BP 116/76 mmHg  Pulse 82  Ht  (1.6 m)  Wt 199 lb 3.2 oz (90.357 kg)  BMI 35.30 kg/m2 Last Weight:  Wt Readings from Last 1 Encounters:  09/22/15 199 lb 3.2 oz (90.357 kg)   Last Height:   Ht Readings from Last 1 Encounters:  09/22/15  (1.6 m)    Physical exam: Exam: Gen: NAD, conversant, well nourised, obese, well groomed                     CV: RRR, no MRG. No Carotid Bruits. No peripheral edema, warm, nontender Eyes: Conjunctivae clear without exudates or hemorrhage  Neuro: Detailed Neurologic Exam  Speech:    Speech  is normal; fluent and spontaneous with normal comprehension.  Cognition:    The patient is oriented to person, place, and time;     recent and remote memory intact;     language fluent;     normal attention, concentration,     fund of knowledge Cranial Nerves:    The pupils are equal, round, and reactive to light. Bilateral edema. Visual fields are full to finger confrontation. OS 20/30, OD 20/30. Extraocular movements are intact. Trigeminal sensation is intact and the muscles of mastication are normal. The face is symmetric. The palate elevates in the midline. Hearing intact. Voice is normal. Shoulder shrug is normal. The tongue has normal motion without fasciculations.   Coordination:    Normal finger to nose and heel to shin. Normal rapid alternating movements.   Gait:    Heel-toe and tandem gait are normal.   Motor Observation:    No asymmetry, no atrophy, and no involuntary movements noted. Tone:    Normal muscle tone.    Posture:    Posture is normal. normal erect    Strength:    Strength is V/V in the upper and lower limbs.      Sensation: intact to LT     Reflex Exam:  DTR's:    Deep tendon reflexes in the upper and lower extremities are normal bilaterally.   Toes:    The toes are downgoing bilaterally.   Clonus:    Clonus is absent.      Assessment/Plan:  40 year old patient with PMHx of IIH with recurrence of bilateral edema,   Remember to drink plenty of fluid, eat healthy meals and do not skip any meals. Try to eat protein with a every meal and eat a healthy snack such as fruit or nuts in between meals. Try to keep a regular sleep-wake schedule and try to exercise daily, particularly in the form of walking, 20-30 minutes a day, if you can.   As far as diagnostic testing: LP, MRI of the brain and orbits, MRV of the head, ophthalmology referral, acetazolamide  twice a day  Discussed possible IIH, risk for permanent vision loss, proceed to ED if vision  worsens. Will try to request notes but may not be available since IIh was in 2006. Follow up after workup complete Discussed side effects of diamox. Serious side effects can inclue: Abdominal or stomach pain ,fever, chills, or sore throat , lessening of sensations or perception ,loss of appetite , mood or mental changes, including aggression, agitation, apathy, irritability, and mental depression , red, irritated, or bleeding gums , weight loss. Do not get pregnant, teratogenicity. Rare Blood in  the urine , decrease in sexual performance or desire , difficult or painful urination , frequent urination , hearing loss , loss of bladder control , lower back or side pain , nosebleeds , pale skin , red or irritated eyes , ringing or buzzing in the ears , skin rash or itching , swelling , trouble breathing, Common side effects of Topamax include:  tiredness, drowsiness, dizziness, nervousness, numbness or tingly feeling, coordination problems, diarrhea, weight loss, speech/language problems, changes in vision, sensory distortion, loss of appetite, bad taste in your mouth, confusion, slowed thinking, trouble concentrating or paying attention, memory problems,      To prevent or relieve headaches, try the following: Cool Compress. Lie down and place a cool compress on your head.  Avoid headache triggers. If certain foods or odors seem to have triggered your headaches in the past, avoid them. A headache diary might help you identify triggers.  Include physical activity in your daily routine. Try a daily walk or other moderate aerobic exercise.  Manage stress. Find healthy ways to cope with the stressors, such as delegating tasks on your to-do list.  Practice relaxation techniques. Try deep breathing, yoga, massage and visualization.  Eat regularly. Eating regularly scheduled meals and maintaining a healthy diet might help prevent headaches. Also, drink plenty of fluids.  Follow a regular sleep schedule. Sleep  deprivation might contribute to headaches Consider biofeedback. With this mind-body technique, you learn to control certain bodily functions - such as muscle tension, heart rate and blood pressure - to prevent headaches or reduce headache pain.    Proceed to emergency room if you experience new or worsening symptoms or symptoms do not resolve, if you have new neurologic symptoms or if headache is severe, or for any concerning symptom.    Naomie DeanAntonia Quillan Whitter, MD  [facility] [address] [place], [place] [id number]  Phone [phone] Fax [phone]

## 2015-09-22 NOTE — Patient Instructions (Signed)
Overall you are doing fairly well but I do want to suggest a few things today:   Remember to drink plenty of fluid, eat healthy meals and do not skip any meals. Try to eat protein with a every meal and eat a healthy snack such as fruit or nuts in between meals. Try to keep a regular sleep-wake schedule and try to exercise daily, particularly in the form of walking, 20-30 minutes a day, if you can.   As far as diagnostic testing: LP, MRI of the brain and orbits, ophthalmology referral, acetazolamide 250mg  twice a day  I would like to see you back after workup, sooner if we need to. Please call us with any interim questions, concerns, problems, updates or refill requests.   Our phone number is [phone]. We also have an after hours call service for urgent matters and there is a physician on-call for urgent questions. For any emergencies you know to call 911 or go to the nearest emergency room

## 2015-09-23 LAB — COMPREHENSIVE METABOLIC PANEL
ALBUMIN: 4.5 g/dL (ref 3.5–5.5)
ALT: 8 IU/L (ref 0–32)
AST: 13 IU/L (ref 0–40)
Albumin/Globulin Ratio: 1.8 (ref 1.2–2.2)
Alkaline Phosphatase: 63 IU/L (ref 39–117)
BUN / CREAT RATIO: 13 (ref 9–23)
BUN: 11 mg/dL (ref 6–24)
Bilirubin Total: 0.5 mg/dL (ref 0.0–1.2)
CO2: 23 mmol/L (ref 18–29)
CREATININE: 0.83 mg/dL (ref 0.57–1.00)
Calcium: 9.5 mg/dL (ref 8.7–10.2)
Chloride: 97 mmol/L (ref 96–106)
GFR, EST AFRICAN AMERICAN: 101 mL/min/{1.73_m2} (ref >59)
GFR, EST NON AFRICAN AMERICAN: 88 mL/min/{1.73_m2} (ref >59)
GLUCOSE: 91 mg/dL (ref 65–99)
Globulin, Total: 2.5 g/dL (ref 1.5–4.5)
POTASSIUM: 3.9 mmol/L (ref 3.5–5.2)
SODIUM: 138 mmol/L (ref 134–144)
TOTAL PROTEIN: 7 g/dL (ref 6.0–8.5)

## 2015-09-23 LAB — CBC
Hematocrit: 39.9 % (ref 34.0–46.6)
Hemoglobin: 13.5 g/dL (ref 11.1–15.9)
MCH: 30.6 pg (ref 26.6–33.0)
MCHC: 33.8 g/dL (ref 31.5–35.7)
MCV: 91 fL (ref 79–97)
PLATELETS: 308 10*3/uL (ref 150–379)
RBC: 4.41 x10E6/uL (ref 3.77–5.28)
RDW: 13.8 % (ref 12.3–15.4)
WBC: 5.5 10*3/uL (ref 3.4–10.8)

## 2015-09-25 ENCOUNTER — Telehealth: Payer: Self-pay | Admitting: *Deleted

## 2015-09-25 NOTE — Telephone Encounter (Signed)
Spoke to pt about normal labs per Dr Ahern. Pt verbalized understanding.  

## 2015-09-25 NOTE — Telephone Encounter (Signed)
-----   Message from Anson FretAntonia B Ahern, MD sent at 09/23/2015 12:22 PM EDT ----- Labs normal thanks

## 2015-09-27 ENCOUNTER — Encounter: Payer: Self-pay | Admitting: Neurology

## 2015-09-27 DIAGNOSIS — R519 Headache, unspecified: Secondary | ICD-10-CM | POA: Insufficient documentation

## 2015-09-27 DIAGNOSIS — H4710 Unspecified papilledema: Secondary | ICD-10-CM | POA: Insufficient documentation

## 2015-09-27 DIAGNOSIS — R51 Headache: Secondary | ICD-10-CM

## 2015-09-27 DIAGNOSIS — H539 Unspecified visual disturbance: Secondary | ICD-10-CM | POA: Insufficient documentation

## 2015-09-27 DIAGNOSIS — H9190 Unspecified hearing loss, unspecified ear: Secondary | ICD-10-CM | POA: Insufficient documentation

## 2015-10-10 ENCOUNTER — Ambulatory Visit
Admission: RE | Admit: 2015-10-10 | Discharge: 2015-10-10 | Disposition: A | Discharge status: Home / Self Care | Payer: 59 | Source: Ambulatory Visit | Attending: Neurology | Admitting: Neurology

## 2015-10-10 DIAGNOSIS — H539 Unspecified visual disturbance: Secondary | ICD-10-CM | POA: Diagnosis not present

## 2015-10-10 DIAGNOSIS — H4710 Unspecified papilledema: Secondary | ICD-10-CM

## 2015-10-10 DIAGNOSIS — G932 Benign intracranial hypertension: Secondary | ICD-10-CM

## 2015-10-10 DIAGNOSIS — G4489 Other headache syndrome: Secondary | ICD-10-CM

## 2015-10-10 DIAGNOSIS — H9190 Unspecified hearing loss, unspecified ear: Secondary | ICD-10-CM

## 2015-10-10 MED ORDER — GADOBENATE DIMEGLUMINE 529 MG/ML IV SOLN
18.0000 mL | Freq: Once | INTRAVENOUS | Status: AC | PRN
  Administered 2015-10-10: 18 mL via INTRAVENOUS

## 2015-10-11 ENCOUNTER — Telehealth: Payer: Self-pay | Admitting: *Deleted

## 2015-10-11 NOTE — Telephone Encounter (Signed)
Called and spoke to pt about MRI brain results per Dr Lucia GaskinsAhern. She states she was waiting to hear about MRI results first before scheduling LP. She has [facility] number and will call to schedule LP. Advised her to continue taking diamox. She verbalized understanding.

## 2015-10-11 NOTE — Telephone Encounter (Signed)
-----   Message from Anson FretAntonia B Ahern, MD sent at 10/11/2015  8:22 AM EDT ----- MRi of the brain consistent with Intracranial Hypertension. Has she scheduled her lumbar puncture yet? Continue diamox thanks

## 2015-10-16 ENCOUNTER — Encounter: Payer: Self-pay | Admitting: Radiology

## 2015-10-17 ENCOUNTER — Other Ambulatory Visit: Payer: Self-pay | Admitting: Neurology

## 2015-10-17 ENCOUNTER — Telehealth: Payer: Self-pay | Admitting: *Deleted

## 2015-10-17 ENCOUNTER — Ambulatory Visit
Admission: RE | Admit: 2015-10-17 | Discharge: 2015-10-17 | Disposition: A | Discharge status: Home / Self Care | Payer: 59 | Source: Ambulatory Visit | Attending: Neurology | Admitting: Neurology

## 2015-10-17 DIAGNOSIS — H9190 Unspecified hearing loss, unspecified ear: Secondary | ICD-10-CM

## 2015-10-17 DIAGNOSIS — G932 Benign intracranial hypertension: Secondary | ICD-10-CM

## 2015-10-17 DIAGNOSIS — H539 Unspecified visual disturbance: Secondary | ICD-10-CM

## 2015-10-17 DIAGNOSIS — G4489 Other headache syndrome: Secondary | ICD-10-CM

## 2015-10-17 DIAGNOSIS — H4710 Unspecified papilledema: Secondary | ICD-10-CM

## 2015-10-17 LAB — CSF CELL COUNT WITH DIFFERENTIAL
RBC Count, CSF: 19 cells/uL — ABNORMAL HIGH (ref 0–10)
WBC, CSF: 1 cells/uL (ref 0–5)

## 2015-10-17 LAB — PROTEIN, CSF: TOTAL PROTEIN, CSF: 29 mg/dL (ref 15–45)

## 2015-10-17 LAB — GLUCOSE, CSF: Glucose, CSF: 50 mg/dL (ref 43–76)

## 2015-10-17 NOTE — Telephone Encounter (Signed)
-----   Message from Anson FretAntonia B Ahern, MD sent at 10/17/2015  1:18 PM EDT ----- Opening pressure was normal and the labs look great. Not sure if the medication(diamox) has just worked really well and quickly, but her test was normal and look great thanks

## 2015-10-17 NOTE — Telephone Encounter (Signed)
LVM about results per Dr Lucia GaskinsAhern note. Gave [facility] phone number if she has further questions.

## 2015-10-17 NOTE — Discharge Instructions (Signed)

## 2015-10-19 ENCOUNTER — Telehealth: Payer: Self-pay | Admitting: Neurology

## 2015-10-19 ENCOUNTER — Ambulatory Visit
Admission: RE | Admit: 2015-10-19 | Discharge: 2015-10-19 | Disposition: A | Discharge status: Home / Self Care | Payer: 59 | Source: Ambulatory Visit | Attending: Neurology | Admitting: Neurology

## 2015-10-19 DIAGNOSIS — G971 Other reaction to spinal and lumbar puncture: Secondary | ICD-10-CM

## 2015-10-19 MED ORDER — IOPAMIDOL (ISOVUE-M 200) INJECTION 41%
1.0000 mL | Freq: Once | INTRAMUSCULAR | Status: AC
  Administered 2015-10-19: 1 mL via EPIDURAL

## 2015-10-19 NOTE — Telephone Encounter (Signed)
Called [place] [facility]. She states they do not have any openings for blood patch, but they will work pt in. Pt needs to come asap this morning.  I called pt and she states she can get there about 945am and she will have someone drive her to and from test. I explained the blood patch procedure briefly to pt.  [facility] and LVM letting her know pt approx. Arrival time. Gave [facility] phone number if she has any questions.

## 2015-10-19 NOTE — Progress Notes (Signed)
[facility], site is unremarkable and pt tolerated procedure well.

## 2015-10-19 NOTE — Telephone Encounter (Signed)
Pt called said she is having migraines and then the call was dropped. Operator called her back and LM to call [facility] back.

## 2015-10-19 NOTE — Telephone Encounter (Signed)
Pt had LP on Tuesday and understands the side effects just is experiencing extreme migraine pain. She also wants to know if she should still be taking acetaZOLAMIDE (DIAMOX) 250 MG tablet. She states she is miserable and has a hard time standing up. Headaches : nausea , dizziness, light sensitivity. Please call [phone]

## 2015-10-19 NOTE — Discharge Instructions (Signed)

## 2017-06-22 IMAGING — XA DG FLUORO GUIDE SPINAL/SI JT INJ*R*
2 series · 2 of 2 positions shown · non-contrast
Comparison: none

CLINICAL DATA: Headache, visual changes, papilledema, and hearing
changes. Idiopathic intracranial hypertension.

[Series 1: ortho adipose · 1 of 1 slices shown (1 of 2)]
[im 1/1]
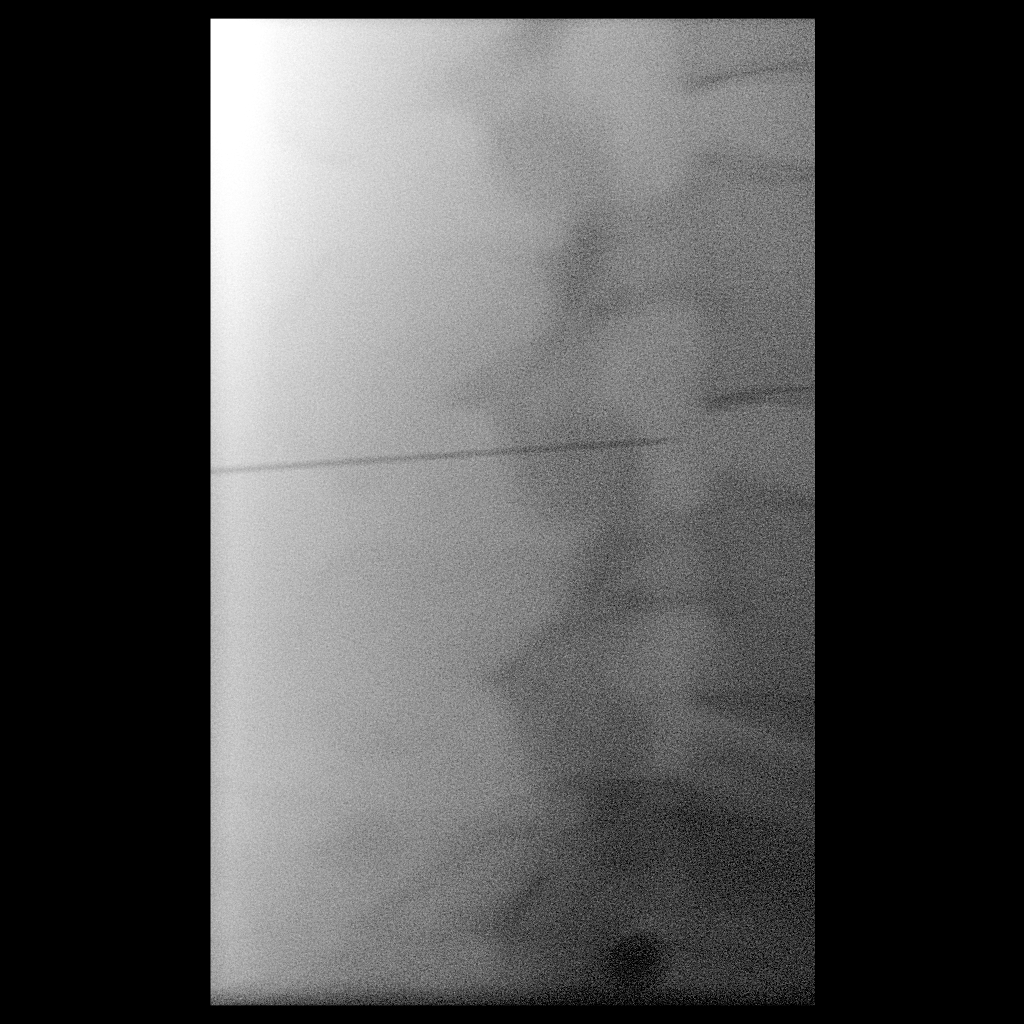

[Series 2: ortho adipose · 1 of 1 slices shown (2 of 2)]
[im 1/1]
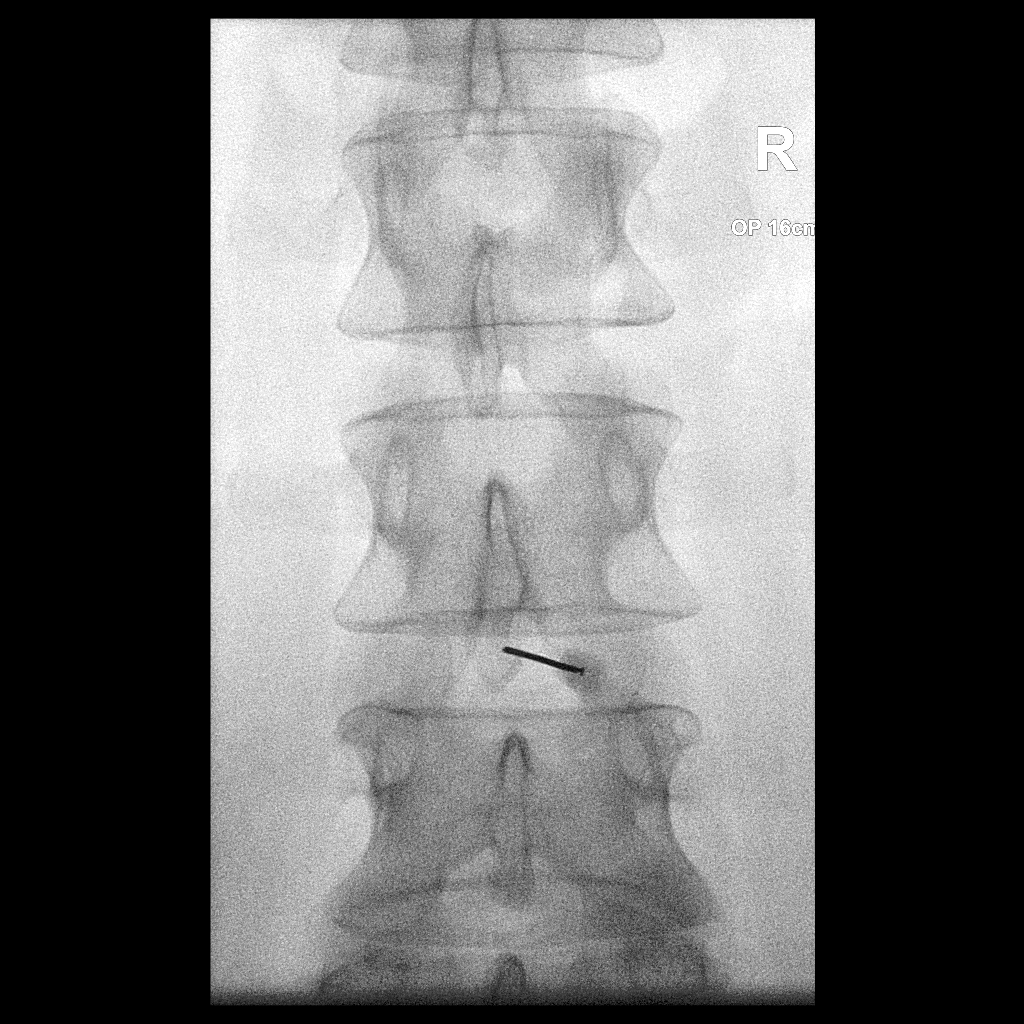

[2 of 2 positions shown; findings below may reference images not displayed]

EXAM:
DIAGNOSTIC LUMBAR PUNCTURE UNDER FLUOROSCOPIC GUIDANCE

FLUOROSCOPY TIME:  Radiation Exposure Index (as provided by the
fluoroscopic device): 25.68 microGray*m^2

Fluoroscopy Time (in minutes and seconds):  5 seconds

PROCEDURE:
Informed consent was obtained from the patient prior to the
procedure, including potential complications of headache, allergy,
and pain. With the patient prone, the lower back was prepped with
Betadine. 1% Lidocaine was used for local anesthesia. Lumbar
puncture was performed at the L3-4 level using a 3.5 inch, 20 gauge
needle via a right interlaminar approach with return of clear CSF
with an opening pressure of 16 cm water (measured in the left
lateral decubitus position). 6 ml of CSF were obtained for
laboratory studies. Closing pressure was 12 cm water. The patient
tolerated the procedure well and there were no apparent
complications.
IMPRESSION: Successful fluoroscopic guided lumbar puncture. Opening pressure 16.

## 2017-09-25 ENCOUNTER — Encounter (INDEPENDENT_AMBULATORY_CARE_PROVIDER_SITE_OTHER): Payer: 59

## 2019-06-22 ENCOUNTER — Other Ambulatory Visit: Payer: Self-pay | Admitting: Obstetrics and Gynecology

## 2019-06-22 DIAGNOSIS — N6310 Unspecified lump in the right breast, unspecified quadrant: Secondary | ICD-10-CM

## 2019-07-05 ENCOUNTER — Ambulatory Visit
Admission: RE | Admit: 2019-07-05 | Discharge: 2019-07-05 | Disposition: A | Discharge status: Home / Self Care | Payer: 59 | Source: Ambulatory Visit | Attending: Obstetrics and Gynecology | Admitting: Obstetrics and Gynecology

## 2019-07-05 ENCOUNTER — Other Ambulatory Visit: Payer: Self-pay

## 2019-07-05 DIAGNOSIS — N6310 Unspecified lump in the right breast, unspecified quadrant: Secondary | ICD-10-CM

## 2019-08-27 ENCOUNTER — Ambulatory Visit: Payer: 59 | Attending: Internal Medicine

## 2019-08-27 DIAGNOSIS — Z23 Encounter for immunization: Secondary | ICD-10-CM

## 2019-08-27 NOTE — Progress Notes (Signed)
   Covid-19 Vaccination Clinic  Name:  Natalie Maxwell    MRN: [id number] DOB: 1976/01/29  08/27/2019  [name] was observed post Covid-19 immunization for 15 minutes without incident. She was provided with Vaccine Information Sheet and instruction to access the V-Safe system.   [name] was instructed to call 911 with any severe reactions post vaccine: Marland Kitchen Difficulty breathing  . Swelling of face and throat  . A fast heartbeat  . A bad rash all over body  . Dizziness and weakness   Immunizations Administered    Name Date Dose VIS Date Route   Pfizer COVID-19 Vaccine 08/27/2019  2:25 PM 0.3 mL 05/14/2019 Intramuscular   Manufacturer: [employer], [employer]   Lot: [id number]   NDC: [address]-3

## 2019-09-21 ENCOUNTER — Ambulatory Visit: Payer: 59 | Attending: Internal Medicine

## 2019-09-21 DIAGNOSIS — Z23 Encounter for immunization: Secondary | ICD-10-CM

## 2019-09-21 NOTE — Progress Notes (Signed)
   Covid-19 Vaccination Clinic  Name:  Rosalynn Sergent    MRN: [id number] DOB: [birth year]  09/21/2019  Ms. Longhi was observed post Covid-19 immunization for 15 minutes without incident. She was provided with Vaccine Information Sheet and instruction to access the V-Safe system.   Ms. Esteve was instructed to call 911 with any severe reactions post vaccine: Marland Kitchen Difficulty breathing  . Swelling of face and throat  . A fast heartbeat  . A bad rash all over body  . Dizziness and weakness   Immunizations Administered    Name Date Dose VIS Date Route   Pfizer COVID-19 Vaccine 09/21/2019  2:21 PM 0.3 mL 07/28/2018 Intramuscular   Manufacturer: [employer], [employer]   Lot: [id number]   NDC: [address]-8

## 2023-09-09 ENCOUNTER — Other Ambulatory Visit: Payer: Self-pay | Admitting: Urology

## 2023-09-09 DIAGNOSIS — N281 Cyst of kidney, acquired: Secondary | ICD-10-CM

## 2023-09-11 ENCOUNTER — Other Ambulatory Visit: Payer: Self-pay | Admitting: Urology

## 2023-09-16 ENCOUNTER — Other Ambulatory Visit

## 2023-09-25 NOTE — Patient Instructions (Signed)
 DUE TO COVID-19 ONLY TWO VISITORS  (aged 49 and older)  ARE ALLOWED TO COME WITH YOU AND STAY IN THE WAITING ROOM ONLY DURING PRE OP AND PROCEDURE.   **NO VISITORS ARE ALLOWED IN THE SHORT STAY AREA OR RECOVERY ROOM!!**  IF YOU WILL BE ADMITTED INTO THE HOSPITAL YOU ARE ALLOWED ONLY FOUR SUPPORT PEOPLE DURING VISITATION HOURS ONLY (7 AM -8PM)   The support person(s) must pass our screening, gel in and out, and wear a mask at all times, including in the patient's room. Patients must also wear a mask when staff or their support person are in the room. Visitors GUEST BADGE MUST BE WORN VISIBLY  One adult visitor may remain with you overnight and MUST be in the room by 8 P.M.     Your procedure is scheduled on: 09/30/23   [facility] to [facility] Main En[facility]    Report to admitting at : 12:00 PM   Call this number if you have problems the morning of surgery [phone]   Do not eat food :After Midnight.   After Midnight you may have the following liquids until : 11:00 AM DAY OF SURGERY  Water Black Coffee (sugar ok, NO MILK/CREAM OR CREAMERS)  Tea (sugar ok, NO MILK/CREAM OR CREAMERS) regular and decaf                             Plain Jell-O (NO RED)                                           Fruit ices (not with fruit pulp, NO RED)                                     Popsicles (NO RED)                                                                  Juice: apple, WHITE grape, WHITE cranberry Sports drinks like Gatorade (NO RED)              FOLLOW ANY ADDITIONAL PRE OP INSTRUCTIONS YOU RECEIVED FROM YOUR SURGEON'S OFFICE!!!   Oral Hygiene is also important to reduce your risk of infection.                                    Remember - BRUSH YOUR TEETH THE MORNING OF SURGERY WITH YOUR REGULAR TOOTHPASTE  DENTURES WILL BE REMOVED PRIOR TO SURGERY PLEASE DO NOT APPLY "Poly grip" OR ADHESIVES!!!   Do NOT smoke after Midnight   Take these medicines the morning of surgery with A  SIP OF WATER: bupropion,pristiq.Tylenol as needed.  STOP TAKING all Vitamins, Herbs and supplements 1 week before your surgery.                               You may not have any metal on your body including  hair pins, jewelry, and body piercing             Do not wear make-up, lotions, powders, perfumes/cologne, or deodorant  Do not wear nail polish including gel and S&S, artificial/acrylic nails, or any other type of covering on natural nails including finger and toenails. If you have artificial nails, gel coating, etc. that needs to be removed by a nail salon please have this removed prior to surgery or surgery may need to be canceled/ delayed if the surgeon/ anesthesia feels like they are unable to be safely monitored.   Do not shave  48 hours prior to surgery.    Do not bring valuables to the hospital. [facility] IS NOT             RESPONSIBLE   FOR VALUABLES.   Contacts, glasses, or bridgework may not be worn into surgery.   Bring small overnight bag day of surgery.   DO NOT BRING YOUR HOME MEDICATIONS TO THE HOSPITAL. PHARMACY WILL DISPENSE MEDICATIONS LISTED ON YOUR MEDICATION LIST TO YOU DURING YOUR ADMISSION IN THE HOSPITAL!    Patients discharged on the day of surgery will not be allowed to drive home.  Someone NEEDS to stay with you for the first 24 hours after anesthesia.   Special Instructions: Bring a copy of your healthcare power of attorney and living will documents         the day of surgery if you haven't scanned them before.              Please read over the following fact sheets you were given: IF YOU HAVE QUESTIONS ABOUT YOUR PRE-OP INSTRUCTIONS PLEASE CALL [phone]    [facility] - Preparing for Surgery Before surgery, you can play an important role.  Because skin is not sterile, your skin needs to be as free of germs as possible.  You can reduce the number of germs on your skin by washing with CHG (chlorahexidine gluconate) soap before surgery.  CHG is an  antiseptic cleaner which kills germs and bonds with the skin to continue killing germs even after washing. Please DO NOT use if you have an allergy to CHG or antibacterial soaps.  If your skin becomes reddened/irritated stop using the CHG and inform your nurse when you arrive at Short Stay. Do not shave (including legs and underarms) for at least 48 hours prior to the first CHG shower.  You may shave your face/neck. Please follow these instructions carefully:  1.  Shower with CHG Soap the night before surgery and the  morning of Surgery.  2.  If you choose to wash your hair, wash your hair first as usual with your  normal  shampoo.  3.  After you shampoo, rinse your hair and body thoroughly to remove the  shampoo.                           4.  Use CHG as you would any other liquid soap.  You can apply chg directly  to the skin and wash                       Gently with a scrungie or clean washcloth.  5.  Apply the CHG Soap to your body ONLY FROM THE NECK DOWN.   Do not use on face/ open  Wound or open sores. Avoid contact with eyes, ears mouth and genitals (private parts).                       Wash face,  Genitals (private parts) with your normal soap.             6.  Wash thoroughly, paying special attention to the area where your surgery  will be performed.  7.  Thoroughly rinse your body with warm water from the neck down.  8.  DO NOT shower/wash with your normal soap after using and rinsing off  the CHG Soap.                9.  Pat yourself dry with a clean towel.            10.  Wear clean pajamas.            11.  Place clean sheets on your bed the night of your first shower and do not  sleep with pets. Day of Surgery : Do not apply any lotions/deodorants the morning of surgery.  Please wear clean clothes to the hospital/surgery center.  FAILURE TO FOLLOW THESE INSTRUCTIONS MAY RESULT IN THE CANCELLATION OF YOUR SURGERY PATIENT  SIGNATURE_________________________________  NURSE SIGNATURE__________________________________  ________________________________________________________________________

## 2023-09-29 ENCOUNTER — Encounter (HOSPITAL_COMMUNITY)
Admission: RE | Admit: 2023-09-29 | Discharge: 2023-09-29 | Disposition: A | Discharge status: Home / Self Care | Source: Ambulatory Visit | Attending: Urology | Admitting: Urology

## 2023-09-29 ENCOUNTER — Encounter (HOSPITAL_COMMUNITY): Payer: Self-pay | Admitting: *Deleted

## 2023-09-29 ENCOUNTER — Other Ambulatory Visit: Payer: Self-pay

## 2023-09-29 VITALS — BP 200/130 | HR 98 | Temp 98.7°F | Ht 64.0 in | Wt 240.0 lb

## 2023-09-29 DIAGNOSIS — G932 Benign intracranial hypertension: Secondary | ICD-10-CM

## 2023-09-29 DIAGNOSIS — Z01812 Encounter for preprocedural laboratory examination: Secondary | ICD-10-CM | POA: Insufficient documentation

## 2023-09-29 DIAGNOSIS — I493 Ventricular premature depolarization: Secondary | ICD-10-CM | POA: Insufficient documentation

## 2023-09-29 DIAGNOSIS — Z01818 Encounter for other preprocedural examination: Secondary | ICD-10-CM | POA: Diagnosis present

## 2023-09-29 HISTORY — DX: Anemia, unspecified: D64.9

## 2023-09-29 LAB — CBC
HCT: 42 % (ref 36.0–46.0)
Hemoglobin: 14 g/dL (ref 12.0–15.0)
MCH: 34.3 pg — ABNORMAL HIGH (ref 26.0–34.0)
MCHC: 33.3 g/dL (ref 30.0–36.0)
MCV: 102.9 fL — ABNORMAL HIGH (ref 80.0–100.0)
Platelets: 286 10*3/uL (ref 150–400)
RBC: 4.08 MIL/uL (ref 3.87–5.11)
RDW: 16.1 % — ABNORMAL HIGH (ref 11.5–15.5)
WBC: 7.5 10*3/uL (ref 4.0–10.5)
nRBC: 0 % (ref 0.0–0.2)

## 2023-09-29 LAB — BASIC METABOLIC PANEL WITH GFR
Anion gap: 7 (ref 5–15)
BUN: 15 mg/dL (ref 6–20)
CO2: 26 mmol/L (ref 22–32)
Calcium: 8.7 mg/dL — ABNORMAL LOW (ref 8.9–10.3)
Chloride: 104 mmol/L (ref 98–111)
Creatinine, Ser: 1.17 mg/dL — ABNORMAL HIGH (ref 0.44–1.00)
GFR, Estimated: 57 mL/min — ABNORMAL LOW (ref >60)
Glucose, Bld: 110 mg/dL — ABNORMAL HIGH (ref 70–99)
Potassium: 3.7 mmol/L (ref 3.5–5.1)
Sodium: 137 mmol/L (ref 135–145)

## 2023-09-29 NOTE — Progress Notes (Signed)
 For Anesthesia: PCP - Sallye Crease, NP  Cardiologist - Armen Land, MD . Holli Lunger: 03/28/23  Bowel Prep reminder:  Chest x-ray -  EKG - 03/28/23: [facility] Stress Test -  ECHO - 08/16/22: [facility] Cardiac Cath -  Pacemaker/ICD device last checked: Pacemaker orders received: Device Rep notified:  Spinal Cord Stimulator:N/A  Sleep Study - Yes CPAP - YES (Pt. Does not use it)  Fasting Blood Sugar - N/A Checks Blood Sugar _____ times a day Date and result of last Hgb A1c-  Last dose of GLP1 agonist- Semaglutide GLP1 instructions: On hold for 2 weeks.  Last dose of SGLT-2 inhibitors- N/A SGLT-2 instructions:   Blood Thinner Instructions:N/A Aspirin Instructions: Last Dose:  Activity level: Can go up a flight of stairs and activities of daily living without stopping and without chest pain and/or shortness of breath   Able to exercise without chest pain and/or shortness of breath  Anesthesia review: Hx: PAC's,PVC's.BP elevated today: 160/128,176/111,200/130(manually) Jay Meth PA was informed.Pt. is aware that if BP is too elevated,her procedure can be reschedule.  Patient denies shortness of breath, fever, cough and chest pain at PAT appointment   Patient verbalized understanding of instructions that were given to them at the PAT appointment. Patient was also instructed that they will need to review over the PAT instructions again at home before surgery.

## 2023-09-29 NOTE — H&P (Signed)
 CC/HPI: cc: microscopic hematuria   08/05/19: cystoscopy   07/15/19: 48 year old woman with a history of microscopic hematuria and prior negative cystoscopy 2012 referred back for persistent microscopic hematuria. Patient denies history of gross hematuria urinary frequency/urgency, kidney stones or frequent UTIs. She was treated for a UTI a few months ago. Urinalysis today shows 3-10 RBCs per high-powered field but also wbc's and bacteria. Patient does have a significant smoking history of 20+ pack-year history. She transitioned to E cigarettes about 3 years ago and she currently uses these. Patient has no family history of bladder or kidney cancer.   05/07/23: 48 year old woman with a history of microscopic hematuria who underwent a negative evaluation in 2021 referred back for persistent microscopic hematuria. Patient does have a tobacco history but has since quit smoking. No gross hematuria. Prior CT showed hyperdense lesions on kidney.   09/03/2023: 48 year old woman with a history of microscopic hematuria here for repeat cystoscopy for the same. She has occasional burning but does not feel like she has a full-fledged UTI. She has not yet had an MRI of the kidney performed.     ALLERGIES: Erythromycin    MEDICATIONS: Desvenlafaxine ER 100 MG Tablet Extended Release 24 Hour  Ibuprofen  Mirena (52 MG) 20 MCG/DAY Intrauterine Device  Probiotic  Tylenol  Wellbutrin Xl     GU PSH: Cystoscopy - 2021 Locm 300-399Mg /Ml Iodine,1Ml - 2021     NON-GU PSH: Cesarean Delivery - 2003 Dental Surgery Procedure     GU PMH: Microscopic hematuria - 05/07/2023, We discussed possible sources of hematuria including kidneys/ureters/bladder/urethra. We will proceed with a hematuria workup given her significant smoking history. She will be scheduled for CT urogram followed by cystoscopy., - 2021 Renal cyst - 05/07/2023, Patient with hyperdense renal cyst and smaller lesions too small to characterize. Will proceed  with an MRI of the kidneys to follow-up with a hyperdense cyst., - 2021    NON-GU PMH: Anxiety Depression Hypercholesterolemia Hypertension Sleep Apnea    FAMILY HISTORY: Breast Cancer - Sister Colon Cancer - Grandfather Congestive Heart Failure - Mother   SOCIAL HISTORY: Marital Status: Widowed Preferred Language: English; Ethnicity: Not Hispanic Or Latino; Race: White Current Smoking Status: Patient does not smoke anymore. Has not smoked since 07/04/2016. Smoked for 20 years. Smoked 1 pack per day.   Tobacco Use Assessment Completed: Used Tobacco in last 30 days? Drinks 03 drinks per year.  Does not drink caffeine. 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    REVIEW OF SYSTEMS:    GU Review Female:   Patient reports burning /pain with urination. Patient denies frequent urination, hard to postpone urination, get up at night to urinate, leakage of urine, stream starts and stops, trouble starting your stream, have to strain to urinate, and being pregnant.  Gastrointestinal (Upper):   Patient denies nausea, vomiting, and indigestion/ heartburn.  Gastrointestinal (Lower):   Patient denies diarrhea and constipation.  Constitutional:   Patient denies weight loss, fever, fatigue, and night sweats.  Skin:   Patient denies skin rash/ lesion and itching.  Eyes:   Patient denies blurred vision and double vision.  Ears/ Nose/ Throat:   Patient denies sore throat and sinus problems.  Hematologic/Lymphatic:   Patient denies swollen glands and easy bruising.  Cardiovascular:   Patient denies leg swelling and chest pains.  Respiratory:   Patient denies cough and shortness of breath.  Endocrine:   Patient denies excessive thirst.  Musculoskeletal:   Patient denies back pain and joint pain.  Neurological:  Patient denies headaches and dizziness.  Psychologic:   Patient denies depression and anxiety.   Notes: Burning this morning     VITAL SIGNS: None   GU PHYSICAL EXAMINATION:    External  Genitalia: No hirsutism, no rash, no scarring, no cyst, no erythematous lesion, no papular lesion, no blanched lesion, no warty lesion. No edema.  Urethral Meatus: Normal size. Normal position. No discharge.  Urethra: No tenderness, no mass, no scarring. No hypermobility. No leakage.   MULTI-SYSTEM PHYSICAL EXAMINATION:    Constitutional: Well-nourished. No physical deformities. Normally developed. Good grooming.  Neck: Neck symmetrical, not swollen. Normal tracheal position.  Respiratory: No labored breathing, no use of accessory muscles.   Skin: No paleness, no jaundice, no cyanosis. No lesion, no ulcer, no rash.  Neurologic / Psychiatric: Oriented to time, oriented to place, oriented to person. No depression, no anxiety, no agitation.  Eyes: Normal conjunctivae. Normal eyelids.  Ears, Nose, Mouth, and Throat: Left ear no scars, no lesions, no masses. Right ear no scars, no lesions, no masses. Nose no scars, no lesions, no masses. Normal hearing. Normal lips.  Musculoskeletal: Normal gait and station of head and neck.     Complexity of Data:  Records Review:   Previous Patient Records, POC Tool  Urine Test Review:   Urinalysis   PROCEDURES:         Flexible Cystoscopy - 52000  Risks, benefits, and some of the potential complications of the procedure were discussed at length with the patient including infection, bleeding, voiding discomfort, urinary retention, fever, chills, sepsis, and others. All questions were answered. Informed consent was obtained. Antibiotic prophylaxis was given. Sterile technique and intraurethral analgesia were used.  Meatus:  Normal size. Normal location. Normal condition.  Urethra:  No hypermobility. No leakage.  Ureteral Orifices:  Normal location. Normal size. Normal shape. Effluxed clear urine.  Bladder:  No trabeculation. No tumors. Scattered erythema at base of bladder ? Cystitis. No stones.      The lower urinary tract was carefully examined. The  procedure was well-tolerated and without complications. Antibiotic instructions were given. Instructions were given to call the office immediately for bloody urine, difficulty urinating, urinary retention, painful or frequent urination, fever, chills, nausea, vomiting or other illness. The patient stated that she understood these instructions and would comply with them.         Visit Complexity - G2211          Urinalysis w/Scope Dipstick Dipstick Cont'd Micro  Color: Yellow Bilirubin: Neg mg/dL WBC/hpf: 10 - 16/XWR  Appearance: Slightly Cloudy Ketones: Neg mg/dL RBC/hpf: 3 - 60/AVW  Specific Gravity: 1.025 Blood: 3+ ery/uL Bacteria: Few (10-25/hpf)  pH: 6.0 Protein: Trace mg/dL Cystals: NS (Not Seen)  Glucose: Neg mg/dL Urobilinogen: 0.2 mg/dL Casts: NS (Not Seen)    Nitrites: Neg Trichomonas: Not Present    Leukocyte Esterase: 2+ leu/uL Mucous: Not Present      Epithelial Cells: 0 - 5/hpf      Yeast: NS (Not Seen)      Sperm: Not Present    Notes: QNS for spun micro.    ASSESSMENT:      ICD-10 Details  1 GU:   Microscopic hematuria - R31.21 Chronic, Stable  2   Renal cyst - N28.1 Chronic, Stable   PLAN:           Orders Labs CULTURE, URINE  X-Rays: MRI Kidney With and Without I.V. Contrast          Schedule  Document Letter(s):  Created for Patient: Clinical Summary         Notes:   1. Microscopic hematuria:  -Patient feels well enough to proceed with cystoscopy today  -Will send urine for culture and treat accordingly based on cystoscopy findings that show scattered erythema consistent with cystitis  -If urine culture is negative we discussed moving forward with cystoscopy with bladder biopsy. Risks and benefits of the procedure discussed with the patient in detail including but not limited to pain, bleeding, infection, urgency/frequency, damage surrounding structures, bladder perforation, need for additional treatment.

## 2023-09-30 ENCOUNTER — Encounter (HOSPITAL_COMMUNITY): Payer: Self-pay | Admitting: Urology

## 2023-09-30 ENCOUNTER — Ambulatory Visit (HOSPITAL_COMMUNITY)
Admission: RE | Admit: 2023-09-30 | Discharge: 2023-09-30 | Disposition: A | Discharge status: Home / Self Care | Attending: Urology | Admitting: Urology

## 2023-09-30 ENCOUNTER — Ambulatory Visit (HOSPITAL_BASED_OUTPATIENT_CLINIC_OR_DEPARTMENT_OTHER): Admitting: Anesthesiology

## 2023-09-30 ENCOUNTER — Ambulatory Visit (HOSPITAL_COMMUNITY): Admitting: Physician Assistant

## 2023-09-30 ENCOUNTER — Encounter (HOSPITAL_COMMUNITY)
Admission: RE | Disposition: A | Discharge status: Home / Self Care | Payer: Self-pay | Source: Home / Self Care | Attending: Urology

## 2023-09-30 DIAGNOSIS — F418 Other specified anxiety disorders: Secondary | ICD-10-CM

## 2023-09-30 DIAGNOSIS — N329 Bladder disorder, unspecified: Secondary | ICD-10-CM

## 2023-09-30 DIAGNOSIS — R3129 Other microscopic hematuria: Secondary | ICD-10-CM | POA: Insufficient documentation

## 2023-09-30 DIAGNOSIS — Z79899 Other long term (current) drug therapy: Secondary | ICD-10-CM | POA: Diagnosis not present

## 2023-09-30 DIAGNOSIS — E66813 Obesity, class 3: Secondary | ICD-10-CM | POA: Insufficient documentation

## 2023-09-30 DIAGNOSIS — Z6841 Body Mass Index (BMI) 40.0 and over, adult: Secondary | ICD-10-CM | POA: Diagnosis not present

## 2023-09-30 DIAGNOSIS — Z01818 Encounter for other preprocedural examination: Secondary | ICD-10-CM

## 2023-09-30 DIAGNOSIS — Z87891 Personal history of nicotine dependence: Secondary | ICD-10-CM | POA: Insufficient documentation

## 2023-09-30 LAB — POCT PREGNANCY, URINE: Preg Test, Ur: NEGATIVE

## 2023-09-30 SURGERY — CYSTOSCOPY, WITH BIOPSY
Anesthesia: General

## 2023-09-30 MED ORDER — PROPOFOL 10 MG/ML IV BOLUS
INTRAVENOUS | Status: DC | PRN
  Administered 2023-09-30: 200 mg via INTRAVENOUS

## 2023-09-30 MED ORDER — CEPHALEXIN 250 MG PO CAPS: 250.0000 mg | ORAL_CAPSULE | Freq: Every day | ORAL | 0 refills | Status: AC

## 2023-09-30 MED ORDER — PROPOFOL 10 MG/ML IV BOLUS
INTRAVENOUS | Status: AC
  Filled 2023-09-30: qty 20

## 2023-09-30 MED ORDER — LIDOCAINE HCL (CARDIAC) PF 100 MG/5ML IV SOSY
PREFILLED_SYRINGE | INTRAVENOUS | Status: DC | PRN
  Administered 2023-09-30: 60 mg via INTRATRACHEAL

## 2023-09-30 MED ORDER — OXYCODONE HCL 5 MG PO TABS: 5.0000 mg | ORAL_TABLET | Freq: Once | ORAL | Status: DC | PRN

## 2023-09-30 MED ORDER — MIDAZOLAM HCL 2 MG/2ML IJ SOLN
INTRAMUSCULAR | Status: DC | PRN
  Administered 2023-09-30: 2 mg via INTRAVENOUS

## 2023-09-30 MED ORDER — ACETAMINOPHEN 500 MG PO TABS
1000.0000 mg | ORAL_TABLET | Freq: Once | ORAL | Status: AC
  Administered 2023-09-30: 1000 mg via ORAL
  Filled 2023-09-30: qty 2

## 2023-09-30 MED ORDER — TRAMADOL HCL 50 MG PO TABS: 50.0000 mg | ORAL_TABLET | Freq: Four times a day (QID) | ORAL | 0 refills | Status: AC | PRN

## 2023-09-30 MED ORDER — CEFAZOLIN SODIUM-DEXTROSE 2-4 GM/100ML-% IV SOLN
2.0000 g | INTRAVENOUS | Status: AC
  Administered 2023-09-30: 2 g via INTRAVENOUS
  Filled 2023-09-30: qty 100

## 2023-09-30 MED ORDER — ORAL CARE MOUTH RINSE: 15.0000 mL | Freq: Once | OROMUCOSAL | Status: AC

## 2023-09-30 MED ORDER — ONDANSETRON HCL 4 MG/2ML IJ SOLN: 4.0000 mg | Freq: Once | INTRAMUSCULAR | Status: DC | PRN

## 2023-09-30 MED ORDER — KETOROLAC TROMETHAMINE 30 MG/ML IJ SOLN: 30.0000 mg | Freq: Once | INTRAMUSCULAR | Status: DC | PRN

## 2023-09-30 MED ORDER — FENTANYL CITRATE (PF) 100 MCG/2ML IJ SOLN
INTRAMUSCULAR | Status: AC
  Filled 2023-09-30: qty 2

## 2023-09-30 MED ORDER — MIDAZOLAM HCL 2 MG/2ML IJ SOLN
INTRAMUSCULAR | Status: AC
  Filled 2023-09-30: qty 2

## 2023-09-30 MED ORDER — OXYCODONE HCL 5 MG/5ML PO SOLN: 5.0000 mg | Freq: Once | ORAL | Status: DC | PRN

## 2023-09-30 MED ORDER — HYDROMORPHONE HCL 1 MG/ML IJ SOLN: 0.2500 mg | INTRAMUSCULAR | Status: DC | PRN

## 2023-09-30 MED ORDER — AMISULPRIDE (ANTIEMETIC) 5 MG/2ML IV SOLN: 10.0000 mg | Freq: Once | INTRAVENOUS | Status: DC | PRN

## 2023-09-30 MED ORDER — SODIUM CHLORIDE 0.9 % IR SOLN
Status: DC | PRN
  Administered 2023-09-30: 3000 mL

## 2023-09-30 MED ORDER — ONDANSETRON HCL 4 MG/2ML IJ SOLN
INTRAMUSCULAR | Status: DC | PRN
  Administered 2023-09-30: 4 mg via INTRAVENOUS

## 2023-09-30 MED ORDER — HYOSCYAMINE SULFATE 0.125 MG PO TBDP: 0.1250 mg | ORAL_TABLET | Freq: Four times a day (QID) | ORAL | 0 refills | Status: AC | PRN

## 2023-09-30 MED ORDER — ONDANSETRON HCL 4 MG/2ML IJ SOLN
INTRAMUSCULAR | Status: AC
  Filled 2023-09-30: qty 2

## 2023-09-30 MED ORDER — DEXAMETHASONE SODIUM PHOSPHATE 10 MG/ML IJ SOLN
INTRAMUSCULAR | Status: DC | PRN
  Administered 2023-09-30: 8 mg via INTRAVENOUS

## 2023-09-30 MED ORDER — LACTATED RINGERS IV SOLN: INTRAVENOUS | Status: DC

## 2023-09-30 MED ORDER — LACTATED RINGERS IV SOLN: INTRAVENOUS | Status: DC | PRN

## 2023-09-30 MED ORDER — DEXAMETHASONE SODIUM PHOSPHATE 10 MG/ML IJ SOLN
INTRAMUSCULAR | Status: AC
  Filled 2023-09-30: qty 1

## 2023-09-30 MED ORDER — FENTANYL CITRATE (PF) 100 MCG/2ML IJ SOLN
INTRAMUSCULAR | Status: DC | PRN
  Administered 2023-09-30: 25 ug via INTRAVENOUS
  Administered 2023-09-30: 75 ug via INTRAVENOUS

## 2023-09-30 MED ORDER — CHLORHEXIDINE GLUCONATE 0.12 % MT SOLN
15.0000 mL | Freq: Once | OROMUCOSAL | Status: AC
  Administered 2023-09-30: 15 mL via OROMUCOSAL

## 2023-09-30 SURGICAL SUPPLY — 14 items
BAG URINE DRAIN 2000ML AR STRL (UROLOGICAL SUPPLIES) IMPLANT
BAG URO CATCHER STRL LF (MISCELLANEOUS) ×1 IMPLANT
DRAPE FOOT SWITCH (DRAPES) ×1 IMPLANT
ELECT REM PT RETURN 15FT ADLT (MISCELLANEOUS) ×1 IMPLANT
GLOVE BIO SURGEON STRL SZ 6.5 (GLOVE) ×1 IMPLANT
GLOVE SURG LX STRL 7.5 STRW (GLOVE) ×1 IMPLANT
GOWN STRL REUS W/ TWL LRG LVL3 (GOWN DISPOSABLE) ×1 IMPLANT
KIT TURNOVER KIT A (KITS) IMPLANT
LOOP CUT BIPOLAR 24F LRG (ELECTROSURGICAL) IMPLANT
MANIFOLD NEPTUNE II (INSTRUMENTS) ×1 IMPLANT
PACK CYSTO (CUSTOM PROCEDURE TRAY) ×1 IMPLANT
SYRINGE TOOMEY IRRIG 70ML (MISCELLANEOUS) IMPLANT
TUBING CONNECTING 10 (TUBING) ×1 IMPLANT
TUBING UROLOGY SET (TUBING) IMPLANT

## 2023-09-30 NOTE — Discharge Instructions (Addendum)
Cystoscopy patient instructions  Following a cystoscopy, a catheter (a flexible rubber tube) is sometimes left in place to empty the bladder. This may cause some discomfort or a feeling that you need to urinate. Your doctor determines the period of time that the catheter will be left in place. You may have bloody urine for two to three days (Call your doctor if the amount of bleeding increases or does not subside).  You may pass blood clots in your urine, especially if you had a biopsy. It is not unusual to pass small blood clots and have some bloody urine a couple of weeks after your cystoscopy. Again, call your doctor if the bleeding does not subside. You may have: Dysuria (painful urination) Frequency (urinating often) Urgency (strong desire to urinate)  These symptoms are common especially if medicine is instilled into the bladder or a ureteral stent is placed. Avoiding alcohol and caffeine, such as coffee, tea, and chocolate, may help relieve these symptoms. Drink plenty of water, unless otherwise instructed. Your doctor may also prescribe an antibiotic or other medicine to reduce these symptoms.  Cystoscopy results are available soon after the procedure; biopsy results usually take two to four days. Your doctor will discuss the results of your exam with you. Before you go home, you will be given specific instructions for follow-up care. Special Instructions:   If you are going home with a catheter in place do not take a tub bath until removed by your doctor.   You may resume your normal activities.   Do not drive or operate machinery if you are taking narcotic pain medicine.   Be sure to keep all follow-up appointments with your doctor.   Call Your Doctor If: The catheter is not draining You have severe pain You are unable to urinate You have a fever over 101 You have severe bleeding

## 2023-09-30 NOTE — Op Note (Signed)
 Operative Note  Preoperative diagnosis:  1.  Bladder lesion 2. Microscopic hematuria   Postoperative diagnosis: 1.  Normal bladder mucosa  Procedure(s): 1.  Diagnostic cystoscopy   Surgeon: Perley Bradley, MD  Assistants:  None  Anesthesia:  General  Complications:  None  EBL:  minimal  Specimens: 1. bladder  Drains/Catheters: 1.  none  Intraoperative findings:   Normal urethra Bilateral orthotopic Uos Normal bladder mucosa - area of concern previously identified was normal  Indication:  Natalie Maxwell is a 48 y.o. female with microscopic hematuria found to have bladder erythema in absence of UTI.  Description of procedure:  After risks and benefits of the procedure were discussed, informed consent was obtained. The patient was taken to the operating room and placed in the supine position.  Anesthesia was induced and antibiotics were administered.  She was then repositioned in the dorsal lithotomy position.  She was prepped and draped in the usual sterile fashion and a time out was performed.   A 21Fr rigid cystocope was placed in the urethral meatus and advanced into the bladder under direct visualization.  Patient was noted to have bilateral orthotopic ureteral orifices.  The previous we seen areas of erythema had resolved.  Patient had normal bladder mucosa without masses or stones.  No trabeculation was noted.  The patient's bladder was decompressed and the cystoscope was removed.  She emerged from anesthesia and was transferred the PACU in stable condition.  Plan:  Discharge home

## 2023-09-30 NOTE — Transfer of Care (Signed)
 Immediate Anesthesia Transfer of Care Note  Patient: Natalie Maxwell  Procedure(s) Performed: DIAGNOSTIC CYSTOSCOPY  Patient Location: PACU  Anesthesia Type:General  Level of Consciousness: awake, alert , and oriented  Airway & Oxygen Therapy: Patient Spontanous Breathing and Patient connected to nasal cannula oxygen  Post-op Assessment: Report given to RN and Post -op Vital signs reviewed and stable  Post vital signs: Reviewed and stable  Last Vitals:  Vitals Value Taken Time  BP 128/78 09/30/23 1532  Temp 36.4 C 09/30/23 1530  Pulse 88 09/30/23 1536  Resp 26 09/30/23 1536  SpO2 100 % 09/30/23 1536  Vitals shown include unfiled device data.  Last Pain:  Vitals:   09/30/23 1530  TempSrc:   PainSc: 0-No pain      Patients Stated Pain Goal: 4 (09/30/23 1237)  Complications: No notable events documented.

## 2023-09-30 NOTE — Anesthesia Preprocedure Evaluation (Addendum)
 Anesthesia Evaluation  Patient identified by MRN, date of birth, ID band Patient awake    Reviewed: Allergy & Precautions, NPO status , Patient's Chart, lab work & pertinent test results  Airway Mallampati: II  TM Distance: >3 FB Neck ROM: Full    Dental no notable dental hx.    Pulmonary former smoker   Pulmonary exam normal        Cardiovascular negative cardio ROS Normal cardiovascular exam     Neuro/Psych  Headaches PSYCHIATRIC DISORDERS Anxiety Depression       GI/Hepatic negative GI ROS, Neg liver ROS,,,  Endo/Other    Class 3 obesityBMI 41 Patient on GLP-1 Agonist. LD: 09/12/23  Renal/GU negative Renal ROS Bladder dysfunction      Musculoskeletal negative musculoskeletal ROS (+)    Abdominal  (+) + obese  Peds  Hematology negative hematology ROS (+)   Anesthesia Other Findings BLADDER LESION  Reproductive/Obstetrics Hcg negative                             Anesthesia Physical Anesthesia Plan  ASA: 3  Anesthesia Plan: General   Post-op Pain Management: Tylenol PO (pre-op)* and Toradol IV (intra-op)*   Induction: Intravenous  PONV Risk Score and Plan: 4 or greater and Ondansetron, Dexamethasone, Midazolam and Treatment may vary due to age or medical condition  Airway Management Planned: LMA  Additional Equipment: None  Intra-op Plan:   Post-operative Plan: Extubation in OR  Informed Consent: I have reviewed the patients History and Physical, chart, labs and discussed the procedure including the risks, benefits and alternatives for the proposed anesthesia with the patient or authorized representative who has indicated his/her understanding and acceptance.     Dental advisory given  Plan Discussed with: CRNA  Anesthesia Plan Comments:        Anesthesia Quick Evaluation

## 2023-09-30 NOTE — Anesthesia Procedure Notes (Signed)
 Procedure Name: LMA Insertion Date/Time: 09/30/2023 3:03 PM  Performed by: Darlena Ego, CRNAPre-anesthesia Checklist: Patient identified, Emergency Drugs available, Suction available and Patient being monitored Patient Re-evaluated:Patient Re-evaluated prior to induction Oxygen Delivery Method: Circle System Utilized Preoxygenation: Pre-oxygenation with 100% oxygen Induction Type: IV induction Ventilation: Mask ventilation without difficulty LMA: LMA inserted LMA Size: 4.0 Number of attempts: 1 Airway Equipment and Method: Bite block Placement Confirmation: positive ETCO2 and breath sounds checked- equal and bilateral Tube secured with: Tape Dental Injury: Teeth and Oropharynx as per pre-operative assessment

## 2023-10-01 ENCOUNTER — Encounter (HOSPITAL_COMMUNITY): Payer: Self-pay | Admitting: Urology

## 2023-10-01 NOTE — Anesthesia Postprocedure Evaluation (Signed)
 Anesthesia Post Note  Patient: Natalie Maxwell  Procedure(s) Performed: DIAGNOSTIC CYSTOSCOPY     Patient location during evaluation: PACU Anesthesia Type: General Level of consciousness: awake Pain management: pain level controlled Vital Signs Assessment: post-procedure vital signs reviewed and stable Respiratory status: spontaneous breathing, nonlabored ventilation and respiratory function stable Cardiovascular status: blood pressure returned to baseline and stable Postop Assessment: no apparent nausea or vomiting Anesthetic complications: no   No notable events documented.  Last Vitals:  Vitals:   09/30/23 1609 09/30/23 1615  BP: 132/83 126/83  Pulse: 68 69  Resp: 18 20  Temp: (!) 36.4 C (!) 36.4 C  SpO2: 100% 100%    Last Pain:  Vitals:   09/30/23 1625  TempSrc:   PainSc: 0-No pain                 Jovon Winterhalter P Nirav Sweda

## 2023-10-03 ENCOUNTER — Ambulatory Visit
Admission: RE | Admit: 2023-10-03 | Discharge: 2023-10-03 | Disposition: A | Discharge status: Home / Self Care | Source: Ambulatory Visit | Attending: Urology | Admitting: Urology

## 2023-10-03 DIAGNOSIS — N281 Cyst of kidney, acquired: Secondary | ICD-10-CM

## 2023-10-03 MED ORDER — GADOPICLENOL 0.5 MMOL/ML IV SOLN
10.0000 mL | Freq: Once | INTRAVENOUS | Status: AC | PRN
  Administered 2023-10-03: 10 mL via INTRAVENOUS
